# Patient Record
Sex: Male | Born: 1950 | Race: White | Hispanic: No | Marital: Single | Smoking: Current every day smoker
Health system: Southern US, Community
[De-identification: ages and names within clinical notes are randomized; demographics above are authoritative.]

## PROBLEM LIST (undated history)

## (undated) DIAGNOSIS — F250 Schizoaffective disorder, bipolar type: Secondary | ICD-10-CM

## (undated) DIAGNOSIS — F319 Bipolar disorder, unspecified: Secondary | ICD-10-CM

## (undated) DIAGNOSIS — I10 Essential (primary) hypertension: Secondary | ICD-10-CM

## (undated) DIAGNOSIS — F259 Schizoaffective disorder, unspecified: Secondary | ICD-10-CM

---

## 2011-10-23 ENCOUNTER — Other Ambulatory Visit: Payer: Self-pay

## 2011-10-23 ENCOUNTER — Emergency Department (HOSPITAL_COMMUNITY)
Admission: EM | Admit: 2011-10-23 | Discharge: 2011-10-23 | Disposition: A | Discharge status: Home / Self Care | Payer: Medicare Other | Attending: Emergency Medicine | Admitting: Emergency Medicine

## 2011-10-23 ENCOUNTER — Emergency Department (HOSPITAL_COMMUNITY): Payer: Medicare Other

## 2011-10-23 DIAGNOSIS — R070 Pain in throat: Secondary | ICD-10-CM | POA: Insufficient documentation

## 2011-10-23 DIAGNOSIS — J3489 Other specified disorders of nose and nasal sinuses: Secondary | ICD-10-CM | POA: Insufficient documentation

## 2011-10-23 DIAGNOSIS — F319 Bipolar disorder, unspecified: Secondary | ICD-10-CM | POA: Insufficient documentation

## 2011-10-23 DIAGNOSIS — R0989 Other specified symptoms and signs involving the circulatory and respiratory systems: Secondary | ICD-10-CM | POA: Insufficient documentation

## 2011-10-23 DIAGNOSIS — E119 Type 2 diabetes mellitus without complications: Secondary | ICD-10-CM | POA: Insufficient documentation

## 2011-10-23 DIAGNOSIS — R05 Cough: Secondary | ICD-10-CM | POA: Insufficient documentation

## 2011-10-23 DIAGNOSIS — Z8659 Personal history of other mental and behavioral disorders: Secondary | ICD-10-CM | POA: Insufficient documentation

## 2011-10-23 DIAGNOSIS — F411 Generalized anxiety disorder: Secondary | ICD-10-CM | POA: Insufficient documentation

## 2011-10-23 DIAGNOSIS — J111 Influenza due to unidentified influenza virus with other respiratory manifestations: Secondary | ICD-10-CM | POA: Insufficient documentation

## 2011-10-23 DIAGNOSIS — Z79899 Other long term (current) drug therapy: Secondary | ICD-10-CM | POA: Insufficient documentation

## 2011-10-23 DIAGNOSIS — R0602 Shortness of breath: Secondary | ICD-10-CM | POA: Insufficient documentation

## 2011-10-23 DIAGNOSIS — R509 Fever, unspecified: Secondary | ICD-10-CM

## 2011-10-23 DIAGNOSIS — Z7982 Long term (current) use of aspirin: Secondary | ICD-10-CM | POA: Insufficient documentation

## 2011-10-23 DIAGNOSIS — R059 Cough, unspecified: Secondary | ICD-10-CM | POA: Insufficient documentation

## 2011-10-23 DIAGNOSIS — I10 Essential (primary) hypertension: Secondary | ICD-10-CM | POA: Insufficient documentation

## 2011-10-23 DIAGNOSIS — R5381 Other malaise: Secondary | ICD-10-CM | POA: Insufficient documentation

## 2011-10-23 HISTORY — DX: Bipolar disorder, unspecified: F31.9

## 2011-10-23 HISTORY — DX: Essential (primary) hypertension: I10

## 2011-10-23 HISTORY — DX: Schizoaffective disorder, unspecified: F25.9

## 2011-10-23 HISTORY — DX: Schizoaffective disorder, bipolar type: F25.0

## 2011-10-23 LAB — GLUCOSE, CAPILLARY: Glucose-Capillary: 156 mg/dL — ABNORMAL HIGH (ref 70–99)

## 2011-10-23 MED ORDER — SODIUM CHLORIDE 0.9 % IV BOLUS (SEPSIS)
1000.0000 mL | Freq: Once | INTRAVENOUS | Status: AC
  Administered 2011-10-23: 1000 mL via INTRAVENOUS

## 2011-10-23 MED ORDER — IBUPROFEN 400 MG PO TABS
400.0000 mg | ORAL_TABLET | Freq: Once | ORAL | Status: AC
  Administered 2011-10-23: 400 mg via ORAL
  Filled 2011-10-23: qty 1

## 2011-10-23 NOTE — ED Provider Notes (Signed)
History   This chart was scribed for Raeford Razor, MD by Clarita Crane. The patient was seen in room APA02/APA02 and the patient's care was started at 3:08PM.   CSN: [id number] Arrival date & time: 10/23/2011  2:54 PM   First MD Initiated Contact with Patient 10/23/11 1505      Chief Complaint  Patient presents with  . Influenza    (Consider location/radiation/quality/duration/timing/severity/associated sxs/prior treatment) HPI Kevin Fowler is a 60 y.o. male who presents to the Emergency Department complaining of constant moderate cold-like symptoms onset 2 days ago and persistent since with associated nasal and chest congestion, sore throat, weakness, fatigue, cough, mild SOB and fever. Denies chest pain, abdominal pain, chills, nausea. Patient reports having recent sick contact with roommate who is suffering from similar symptoms.  Patient with h/o diabetes, hypertension, bipolar 1 disorder, schizophrenia.   Past Medical History  Diagnosis Date  . Diabetes mellitus   . Hypertension   . Bipolar 1 disorder   . Schizo affective schizophrenia     History reviewed. No pertinent past surgical history.  No family history on file.  History  Substance Use Topics  . Smoking status: Former Games developer  . Smokeless tobacco: Not on file  . Alcohol Use: No      Review of Systems 10 Systems reviewed and are negative for acute change except as noted in the HPI.  Allergies  Thorazine  Home Medications   Current Outpatient Rx  Name Route Sig Dispense Refill  . AMLODIPINE BESYLATE 2.5 MG PO TABS Oral Take 2.5 mg by mouth daily.      . ASPIRIN EC 81 MG PO TBEC Oral Take 81 mg by mouth daily.      Marland Kitchen DIPHENHYDRAMINE HCL 50 MG PO CAPS Oral Take 50 mg by mouth 2 (two) times daily.      Marland Kitchen LEVOTHYROXINE SODIUM 50 MCG PO TABS Oral Take 50 mcg by mouth daily.      Marland Kitchen LORAZEPAM 0.5 MG PO TABS Oral Take 0.5 mg by mouth 2 (two) times daily.      Marland Kitchen OLANZAPINE 15 MG PO TABS Oral Take 30 mg by  mouth at bedtime.      Marland Kitchen RISPERIDONE 3 MG PO TABS Oral Take 3 mg by mouth at bedtime.      . TAMSULOSIN HCL 0.4 MG PO CAPS Oral Take 0.4 mg by mouth daily.        BP 107/82  Pulse 72  Temp(Src) 100.8 F (38.2 C) (Oral)  Resp 22  Ht 5\' 11"  (1.803 m)  Wt 200 lb (90.719 kg)  BMI 27.89 kg/m2  SpO2 98%  Physical Exam  Nursing note and vitals reviewed. Constitutional: He is oriented to person, place, and time. He appears well-developed and well-nourished. No distress.  HENT:  Head: Normocephalic and atraumatic.  Right Ear: Tympanic membrane normal.  Left Ear: Tympanic membrane normal.  Mouth/Throat: Oropharynx is clear and moist. No oropharyngeal exudate.       Poor dentition.   Eyes: Conjunctivae and EOM are normal. Pupils are equal, round, and reactive to light.  Neck: Neck supple. No tracheal deviation present. No thyromegaly present.  Cardiovascular: Normal rate and regular rhythm.  Exam reveals no gallop and no friction rub.   No murmur heard. Pulmonary/Chest: Effort normal. No respiratory distress. He has no wheezes.  Abdominal: Soft. Bowel sounds are normal. He exhibits no distension. There is no tenderness.  Musculoskeletal: Normal range of motion. He exhibits no edema.  Lymphadenopathy:  He has no cervical adenopathy.  Neurological: He is alert and oriented to person, place, and time. No sensory deficit.  Skin: Skin is warm and dry.  Psychiatric: His speech is normal. His mood appears anxious.    ED Course  Procedures (including critical care time)  DIAGNOSTIC STUDIES: Oxygen Saturation is 98% on room air, normal by my interpretation.    COORDINATION OF CARE: 4:30PM- Patient notes experiencing polydipsia recently. Patient reports he is feeling better at this time following administration of IVFs and Ibuprofen. Informed of imaging results and probable cause of current symptoms. Will obtain a CBG to which the patient is agreeable to.    Labs Reviewed - No data to  display Dg Chest 2 View  10/23/2011  *RADIOLOGY REPORT*  Clinical Data: Fever, cough, hypertension  CHEST - 2 VIEW  Comparison: None  Findings: Minimal enlargement of cardiac silhouette. Mediastinal contours pulmonary vascularity normal. Bronchitic changes with mild bibasilar atelectasis. No definite pulmonary infiltrate, pleural effusion or pneumothorax. Old bilateral rib fractures. No acute bony abnormalities identified.  IMPRESSION: Bronchitic changes with bibasilar atelectasis. Enlargement of cardiac silhouette.  Original Report Authenticated By: Lollie Marrow, M.D.   EKG:  Rhythm: normal sinus Rate: 79 AXIS: NORMAL Intervals: normal ST segments: normal Comparison: none provided   1. Fever   2. Influenza       MDM  60yM with general malaise and lightheadedness. Suspect viral illness, possibly flu. Suspect symptoms in triage area related to this too. Febrile, but not toxic. Low clinical suspicion for SBI. Exam nonfocal. CXR with no focal infiltrate. EKG nondiagnostic. Improved after IVF and dose ibuprofen. Tolerated PO in ED and CBG in 150s. Plan symptomatic tx and given strict return precautions.      I personally preformed the services scribed in my presence. The recorded information has been reviewed and considered. Raeford Razor, MD.    Raeford Razor, MD 10/26/11 [phone]

## 2011-10-23 NOTE — ED Notes (Signed)
Pt came in for "flu-like" symptoms.  Upon arrival to ED front desk, pt became lightheaded and was lowered to the floor.  Pt reports feeling "achy all over".  Pt denies any n/v or decreased appetite.  Pt alert and oriented x 3.

## 2012-02-26 ENCOUNTER — Encounter (HOSPITAL_COMMUNITY): Payer: Self-pay | Admitting: *Deleted

## 2012-02-26 ENCOUNTER — Emergency Department (HOSPITAL_COMMUNITY)
Admission: EM | Admit: 2012-02-26 | Discharge: 2012-02-26 | Disposition: A | Discharge status: Other Acute Inpatient Hospital | Payer: Medicare Other | Attending: Emergency Medicine | Admitting: Emergency Medicine

## 2012-02-26 DIAGNOSIS — Z760 Encounter for issue of repeat prescription: Secondary | ICD-10-CM | POA: Insufficient documentation

## 2012-02-26 DIAGNOSIS — Z79899 Other long term (current) drug therapy: Secondary | ICD-10-CM | POA: Insufficient documentation

## 2012-02-26 DIAGNOSIS — F29 Unspecified psychosis not due to a substance or known physiological condition: Secondary | ICD-10-CM | POA: Insufficient documentation

## 2012-02-26 DIAGNOSIS — F319 Bipolar disorder, unspecified: Secondary | ICD-10-CM | POA: Insufficient documentation

## 2012-02-26 DIAGNOSIS — I10 Essential (primary) hypertension: Secondary | ICD-10-CM | POA: Insufficient documentation

## 2012-02-26 DIAGNOSIS — E119 Type 2 diabetes mellitus without complications: Secondary | ICD-10-CM | POA: Insufficient documentation

## 2012-02-26 MED ORDER — LORAZEPAM 1 MG PO TABS
1.0000 mg | ORAL_TABLET | Freq: Once | ORAL | Status: AC
  Administered 2012-02-26: 1 mg via ORAL
  Filled 2012-02-26: qty 1

## 2012-02-26 MED ORDER — ZIPRASIDONE MESYLATE 20 MG IM SOLR
10.0000 mg | Freq: Once | INTRAMUSCULAR | Status: AC
  Administered 2012-02-26: 10 mg via INTRAMUSCULAR
  Filled 2012-02-26: qty 20

## 2012-02-26 NOTE — ED Provider Notes (Signed)
History  This chart was scribed for [employer]. Colon Branch, MD by Cherlynn Perches. The patient was seen in room APA17/APA17. Patient's care was started at 1303.      CSN: [id number]  Arrival date & time 02/26/12  1303   First MD Initiated Contact with Patient 02/26/12 1410      Chief Complaint  Patient presents with  . Medication Refill    (Consider location/radiation/quality/duration/timing/severity/associated sxs/prior treatment) HPI A Level 5 Caveat Applies due to Psychosis Kevin Fowler is a 61 y.o. male who presents to the Emergency Department accompanied with a member of staff from his group home who states patient's psychosis has worsened as a result of not receiving Risperidone and Zyprexa for the past 2 weeks. Staff member notes patient's physician was contacted in order to have prescriptions refilled but they were unavailable after visiting the pharmacy earlier. PCP is Dr. Copper  Past Medical History  Diagnosis Date  . Diabetes mellitus   . Hypertension   . Bipolar 1 disorder   . Schizo affective schizophrenia     History reviewed. No pertinent past surgical history.  No family history on file.  History  Substance Use Topics  . Smoking status: Former Games developer  . Smokeless tobacco: Not on file  . Alcohol Use: No      Review of Systems  Unable to perform ROS: Mental status change    Allergies  Thorazine  Home Medications   Current Outpatient Rx  Name Route Sig Dispense Refill  . AMLODIPINE BESYLATE 2.5 MG PO TABS Oral Take 2.5 mg by mouth daily.      . ASPIRIN EC 81 MG PO TBEC Oral Take 81 mg by mouth daily.      Marland Kitchen DIPHENHYDRAMINE HCL 50 MG PO CAPS Oral Take 50 mg by mouth 2 (two) times daily.      Marland Kitchen LEVOTHYROXINE SODIUM 50 MCG PO TABS Oral Take 50 mcg by mouth daily.      Marland Kitchen OLANZAPINE 15 MG PO TABS Oral Take 30 mg by mouth at bedtime.      Marland Kitchen RISPERIDONE 3 MG PO TABS Oral Take 3 mg by mouth at bedtime.      . TAMSULOSIN HCL 0.4 MG PO CAPS Oral Take 0.4 mg  by mouth daily.        BP 144/94  Pulse 105  Temp(Src) 99 F (37.2 C) (Oral)  Resp 16  SpO2 97%  Physical Exam  Nursing note and vitals reviewed. Constitutional:       Awake, alert, talking nonsense, angry appearance  HENT:  Head: Atraumatic.  Eyes: Right eye exhibits no discharge. Left eye exhibits no discharge.  Neck: Neck supple.  Pulmonary/Chest: Effort normal. He exhibits no tenderness.  Abdominal: Soft. There is no tenderness. There is no rebound.  Musculoskeletal: He exhibits no tenderness.       Baseline ROM, no obvious new focal weakness.  Neurological:        motor strength appears baseline for patient and situation.  Skin: No rash noted.  Psychiatric: His affect is angry. He is agitated.       Agitated, talking nonsense. Psychotic.     ED Course  Procedures (including critical care time)  DIAGNOSTIC STUDIES: Oxygen Saturation is 97% on room air, normal by my interpretation.    COORDINATION OF CARE: 2:39PM- Patient's pharmacy consulted following initial exam to check on availability of medications. Pharmacist states patient's Zyprexa and Risperidone are ready for pick up at this time.    1500 Spoke  with owner and resident caretaker at the home where the patient stays. She advised that now we have verified his medicines are available, she is comfortable picking up with medicines and administering them to the patient. She is comfortable with the patient despite his current behavior and psychosis. She states she has cared for him for 8 years and is familiar with how to diffuse his agitation.   MDM  Bipolar, schizoaffective patient who has been off of his medicines through no fault of his own, now with increased agitation and psychosis. Resident of a family home where the caretaker is comfortable caring for the patient in his current state now that we have verified the availability of his standard medications.Pt with no significant deterioration in condition in the  ED. .The patient appears reasonably screened and/or stabilized for discharge and I doubt any other medical condition or other [facility] requiring further screening, evaluation, or treatment in the ED at this time prior to discharge.   I personally performed the services described in this documentation, which was scribed in my presence. The recorded information has been reviewed and considered.   MDM Reviewed: nursing note and vitals        Nicoletta Dress. Colon Branch, MD 02/29/12 1622
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
## 2012-02-26 NOTE — Discharge Instructions (Signed)
Kevin Fowler's pharmacy has said that there are medicines at the pharmacy for. I have given him a shot of Geodon here with some Ativan. He can begin his regular nighttime medicines again tonight that would be 3 mg of risperidone and 15 mg of Zyprexa. If he does not calm down he should be returned to the emergency room for further evaluation.

## 2012-02-26 NOTE — ED Notes (Signed)
Caregiver states pt was taken off of meds for schizophrenia. Here for medication. Pt continuously talking in triage. Denies SI/HI

## 2013-08-09 IMAGING — CR DG CHEST 2V
2 series · 2 of 2 positions shown · non-contrast
Comparison: None

CLINICAL DATA: Fever, cough, hypertension

CHEST - 2 VIEW

[view not recorded (1 of 2)]
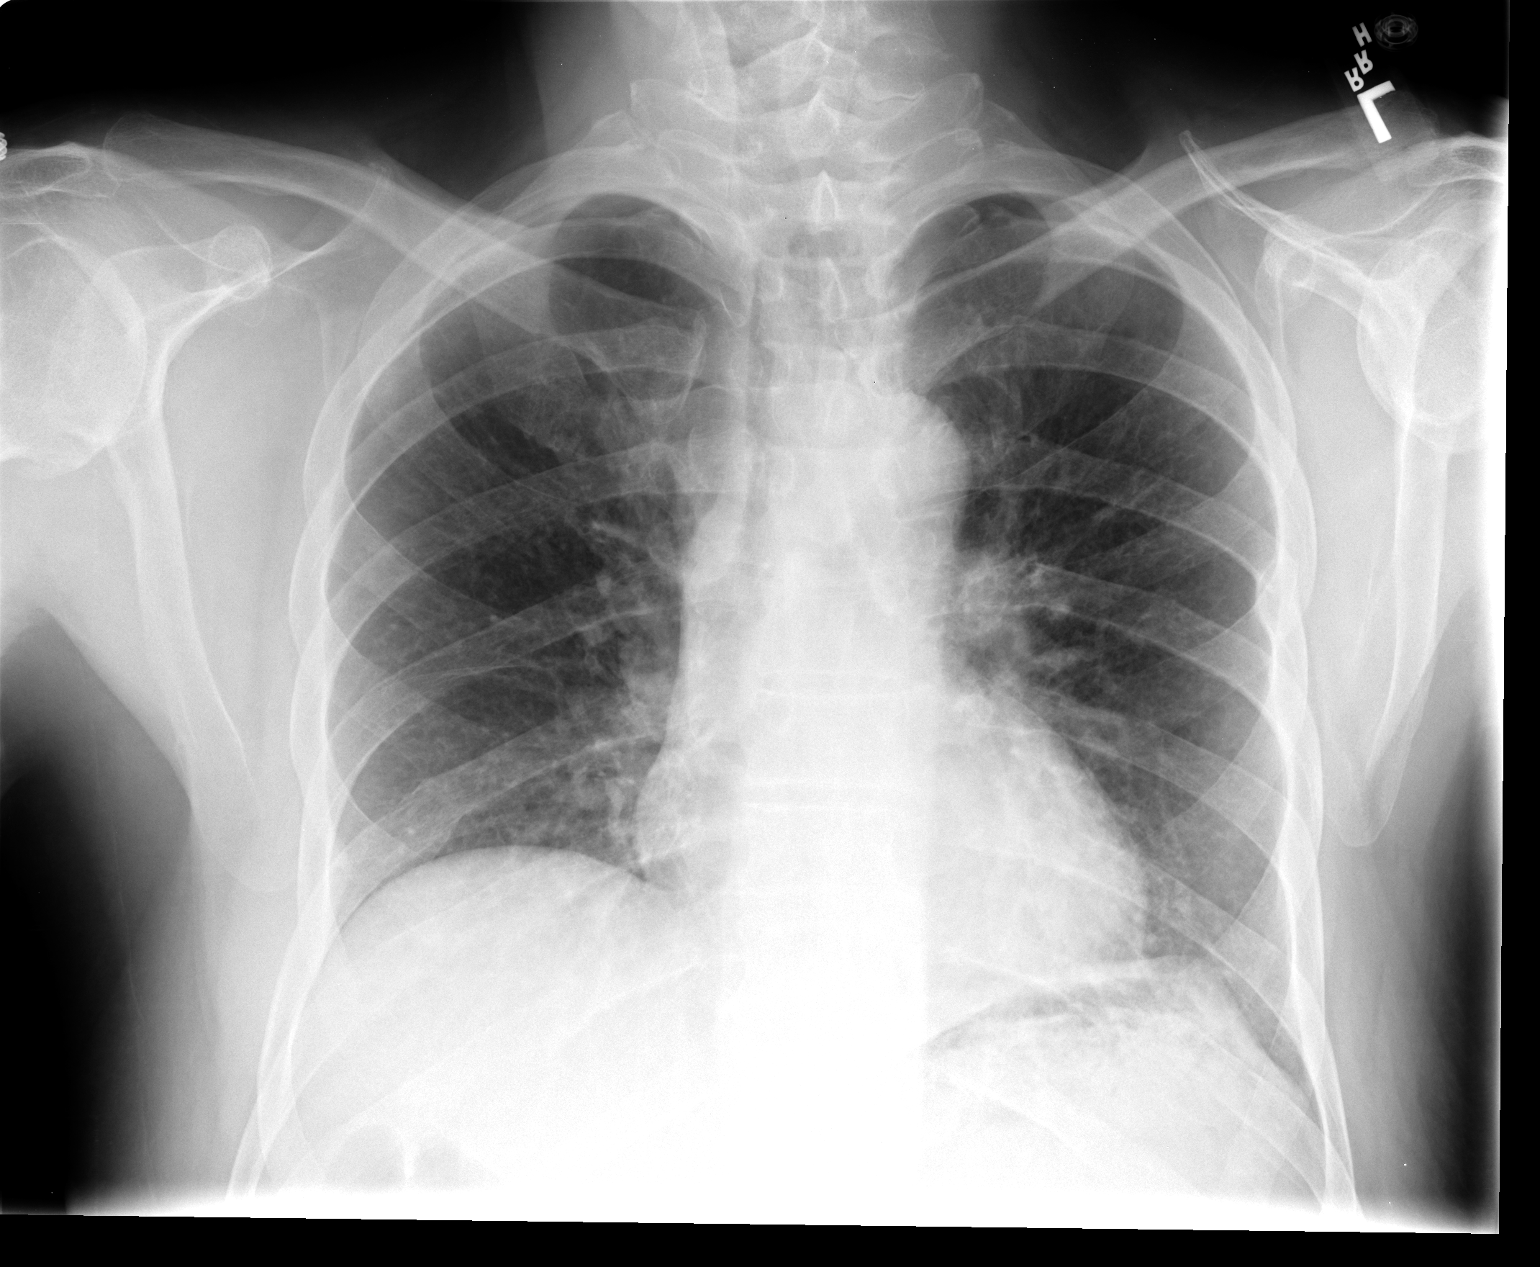

[view not recorded (2 of 2)]
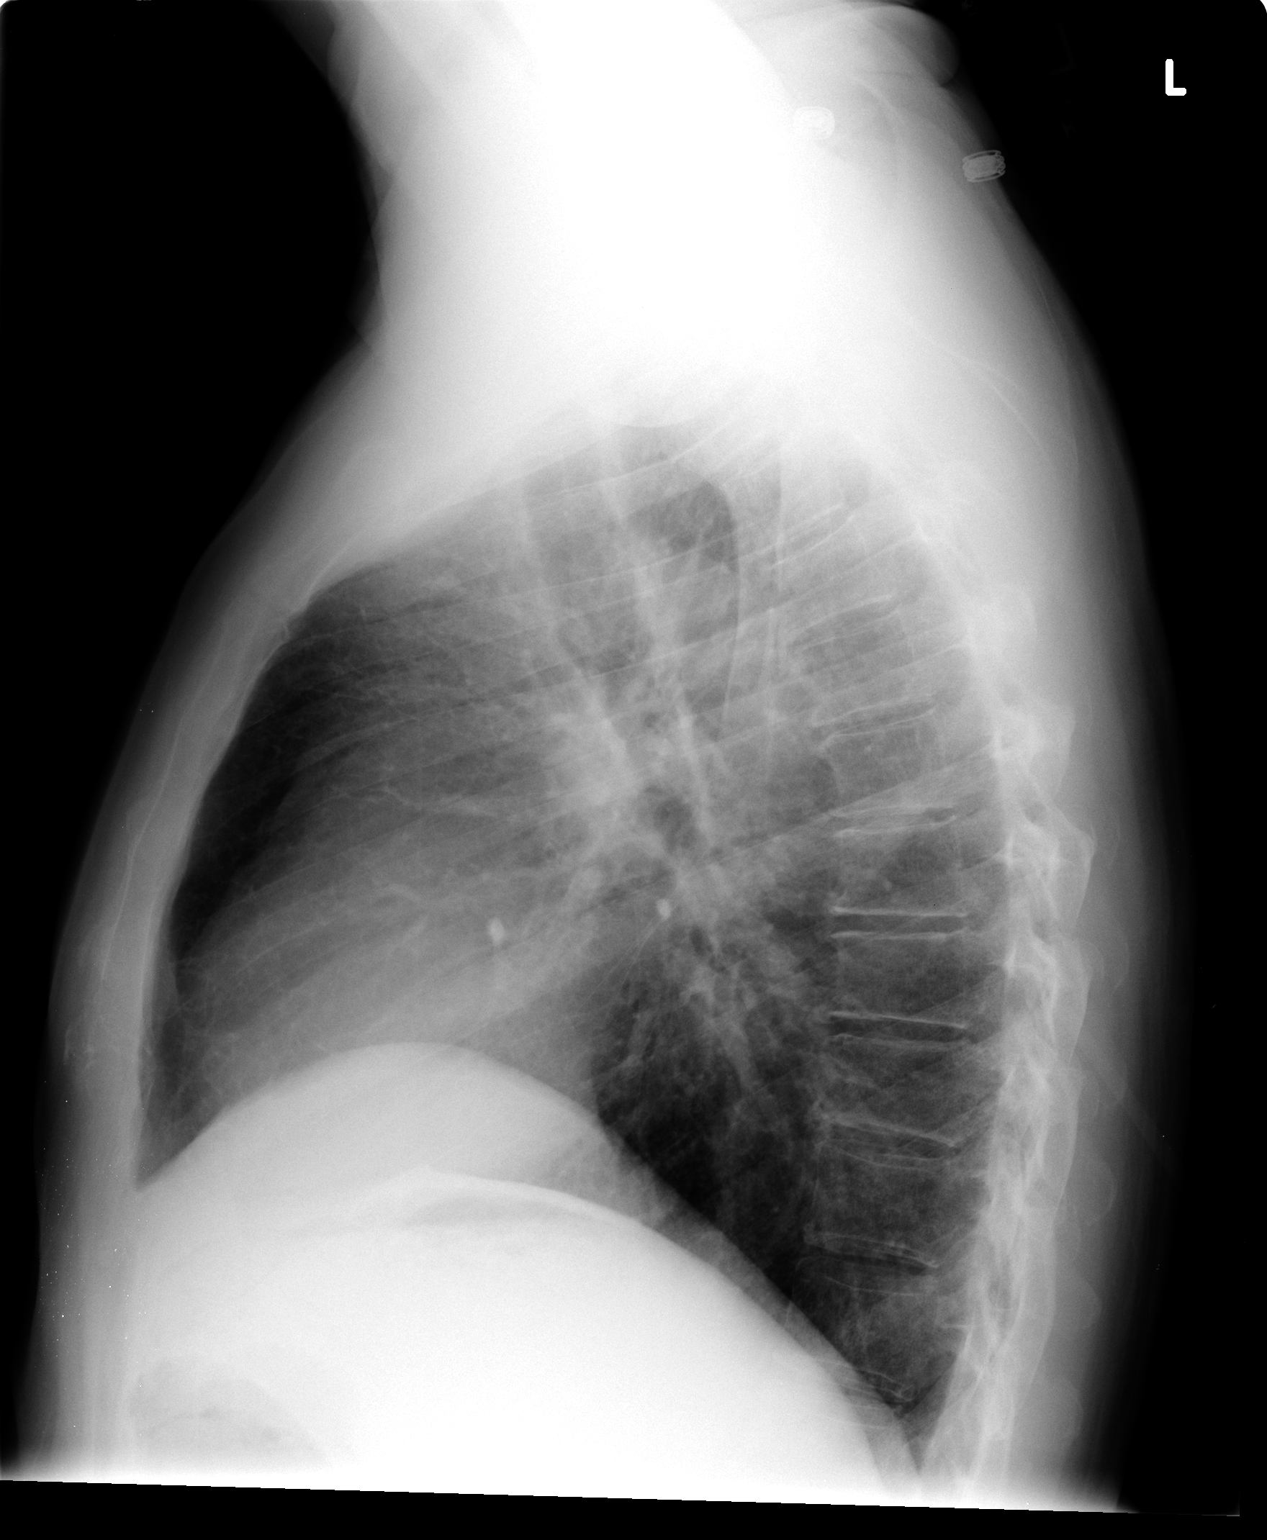

[2 of 2 positions shown; findings below may reference images not displayed]

FINDINGS: Minimal enlargement of cardiac silhouette.
Mediastinal contours pulmonary vascularity normal.
Bronchitic changes with mild bibasilar atelectasis.
No definite pulmonary infiltrate, pleural effusion or pneumothorax.
Old bilateral rib fractures.
No acute bony abnormalities identified.
IMPRESSION: Bronchitic changes with bibasilar atelectasis.
Enlargement of cardiac silhouette.

## 2013-12-17 ENCOUNTER — Emergency Department (HOSPITAL_COMMUNITY)
Admission: EM | Admit: 2013-12-17 | Discharge: 2013-12-18 | Disposition: A | Discharge status: Other Acute Inpatient Hospital | Payer: Medicare Other | Source: Home / Self Care | Attending: Emergency Medicine | Admitting: Emergency Medicine

## 2013-12-17 ENCOUNTER — Encounter (HOSPITAL_COMMUNITY): Payer: Self-pay | Admitting: Emergency Medicine

## 2013-12-17 DIAGNOSIS — Z87891 Personal history of nicotine dependence: Secondary | ICD-10-CM | POA: Insufficient documentation

## 2013-12-17 DIAGNOSIS — E119 Type 2 diabetes mellitus without complications: Secondary | ICD-10-CM | POA: Insufficient documentation

## 2013-12-17 DIAGNOSIS — Z79899 Other long term (current) drug therapy: Secondary | ICD-10-CM

## 2013-12-17 DIAGNOSIS — F29 Unspecified psychosis not due to a substance or known physiological condition: Secondary | ICD-10-CM

## 2013-12-17 DIAGNOSIS — Z7982 Long term (current) use of aspirin: Secondary | ICD-10-CM | POA: Insufficient documentation

## 2013-12-17 DIAGNOSIS — I10 Essential (primary) hypertension: Secondary | ICD-10-CM | POA: Insufficient documentation

## 2013-12-17 DIAGNOSIS — F259 Schizoaffective disorder, unspecified: Secondary | ICD-10-CM

## 2013-12-17 DIAGNOSIS — F3130 Bipolar disorder, current episode depressed, mild or moderate severity, unspecified: Secondary | ICD-10-CM

## 2013-12-17 DIAGNOSIS — F209 Schizophrenia, unspecified: Secondary | ICD-10-CM

## 2013-12-17 LAB — BASIC METABOLIC PANEL
BUN: 19 mg/dL (ref 6–23)
CHLORIDE: 102 meq/L (ref 96–112)
CO2: 28 meq/L (ref 19–32)
CREATININE: 0.96 mg/dL (ref 0.50–1.35)
Calcium: 8.8 mg/dL (ref 8.4–10.5)
GFR calc Af Amer: >90 mL/min (ref >90)
GFR calc non Af Amer: 86 mL/min — ABNORMAL LOW (ref >90)
Glucose, Bld: 91 mg/dL (ref 70–99)
Potassium: 3.6 mEq/L — ABNORMAL LOW (ref 3.7–5.3)
Sodium: 142 mEq/L (ref 137–147)

## 2013-12-17 LAB — RAPID URINE DRUG SCREEN, HOSP PERFORMED
AMPHETAMINES: NOT DETECTED
BARBITURATES: NOT DETECTED
BENZODIAZEPINES: NOT DETECTED
Cocaine: NOT DETECTED
Opiates: NOT DETECTED
Tetrahydrocannabinol: NOT DETECTED

## 2013-12-17 LAB — HEPATIC FUNCTION PANEL
ALT: 23 U/L (ref 0–53)
AST: 22 U/L (ref 0–37)
Albumin: 3.6 g/dL (ref 3.5–5.2)
Alkaline Phosphatase: 95 U/L (ref 39–117)
BILIRUBIN INDIRECT: 0.5 mg/dL (ref 0.3–0.9)
Bilirubin, Direct: 0.2 mg/dL (ref 0.0–0.3)
TOTAL PROTEIN: 6.9 g/dL (ref 6.0–8.3)
Total Bilirubin: 0.7 mg/dL (ref 0.3–1.2)

## 2013-12-17 LAB — CBC
HEMATOCRIT: 38.7 % — AB (ref 39.0–52.0)
Hemoglobin: 12.9 g/dL — ABNORMAL LOW (ref 13.0–17.0)
MCH: 30.6 pg (ref 26.0–34.0)
MCHC: 33.3 g/dL (ref 30.0–36.0)
MCV: 91.7 fL (ref 78.0–100.0)
Platelets: 206 10*3/uL (ref 150–400)
RBC: 4.22 MIL/uL (ref 4.22–5.81)
RDW: 13.7 % (ref 11.5–15.5)
WBC: 9.8 10*3/uL (ref 4.0–10.5)

## 2013-12-17 LAB — ETHANOL: Alcohol, Ethyl (B): <11 mg/dL (ref 0–11)

## 2013-12-17 MED ORDER — RISPERIDONE 1 MG PO TABS
ORAL_TABLET | ORAL | Status: AC
  Filled 2013-12-17: qty 3

## 2013-12-17 MED ORDER — TAMSULOSIN HCL 0.4 MG PO CAPS
0.4000 mg | ORAL_CAPSULE | Freq: Every day | ORAL | Status: DC
  Administered 2013-12-18: 0.4 mg via ORAL
  Filled 2013-12-17 (×5): qty 1

## 2013-12-17 MED ORDER — DIPHENHYDRAMINE HCL 25 MG PO CAPS
50.0000 mg | ORAL_CAPSULE | Freq: Two times a day (BID) | ORAL | Status: DC
  Administered 2013-12-17 – 2013-12-18 (×2): 50 mg via ORAL
  Filled 2013-12-17 (×2): qty 2

## 2013-12-17 MED ORDER — RISPERIDONE 1 MG PO TABS
3.0000 mg | ORAL_TABLET | Freq: Every day | ORAL | Status: DC
  Administered 2013-12-17: 3 mg via ORAL
  Filled 2013-12-17 (×5): qty 3

## 2013-12-17 MED ORDER — AMLODIPINE BESYLATE 5 MG PO TABS
2.5000 mg | ORAL_TABLET | Freq: Every day | ORAL | Status: DC
  Administered 2013-12-18: 2.5 mg via ORAL
  Filled 2013-12-17: qty 1

## 2013-12-17 MED ORDER — FLUPHENAZINE HCL 5 MG PO TABS
ORAL_TABLET | ORAL | Status: AC
  Filled 2013-12-17: qty 2

## 2013-12-17 MED ORDER — FLUPHENAZINE HCL 5 MG PO TABS
10.0000 mg | ORAL_TABLET | Freq: Two times a day (BID) | ORAL | Status: DC
  Administered 2013-12-17 – 2013-12-18 (×2): 10 mg via ORAL
  Filled 2013-12-17: qty 1
  Filled 2013-12-17 (×3): qty 2
  Filled 2013-12-17: qty 1
  Filled 2013-12-17 (×3): qty 2
  Filled 2013-12-17 (×2): qty 1

## 2013-12-17 MED ORDER — ASPIRIN EC 81 MG PO TBEC
81.0000 mg | DELAYED_RELEASE_TABLET | Freq: Every day | ORAL | Status: DC
  Administered 2013-12-18: 81 mg via ORAL
  Filled 2013-12-17 (×5): qty 1

## 2013-12-17 MED ORDER — OLANZAPINE 5 MG PO TABS
30.0000 mg | ORAL_TABLET | Freq: Every day | ORAL | Status: DC
  Administered 2013-12-17: 30 mg via ORAL
  Filled 2013-12-17: qty 6
  Filled 2013-12-17 (×2): qty 3
  Filled 2013-12-17 (×2): qty 6

## 2013-12-17 MED ORDER — LORAZEPAM 1 MG PO TABS: 1.0000 mg | ORAL_TABLET | ORAL | Status: DC | PRN

## 2013-12-17 MED ORDER — LEVOTHYROXINE SODIUM 50 MCG PO TABS
50.0000 ug | ORAL_TABLET | Freq: Every day | ORAL | Status: DC
  Administered 2013-12-18: 50 ug via ORAL
  Filled 2013-12-17 (×5): qty 1

## 2013-12-17 MED ORDER — LEVOTHYROXINE SODIUM 50 MCG PO TABS: 50.0000 ug | ORAL_TABLET | Freq: Every day | ORAL | Status: DC

## 2013-12-17 NOTE — ED Notes (Signed)
Per Caregiver, Tarri AbernethyJeanette Johnson, pt started "wandering off from home" last week after having meds changed.  States pt has had no problems until last week.

## 2013-12-17 NOTE — ED Notes (Signed)
Pt calm, cooperative, pleasant upon entering room to deliver meal tray.  Pt up to bathroom with deputy.  [employer] at bedside at this time.
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
## 2013-12-17 NOTE — ED Notes (Signed)
Pt was wanded by security.  
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
## 2013-12-17 NOTE — BH Assessment (Signed)
[facility] Assessment Progress Note  At 16:10 I spoke to EDP Bethann BerkshireJoseph Zammit, MD in anticipation of TTS assessment scheduled for 16:30.  Doylene Canninghomas Omario Ander, MA Triage Specialist 12/17/2013 @ 16:14

## 2013-12-17 NOTE — ED Notes (Signed)
Pt. Agitated but cooperative. Pt. Talking to himself.
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
## 2013-12-17 NOTE — ED Notes (Signed)
telepsych consult being completed at this time. [employer] at bedside.

## 2013-12-17 NOTE — ED Notes (Signed)
Pt up to bathroom with deputy, after walking out of bathroom, pt began jerking and slamming bathroom door and yelling.  Pt escorted back to room by [employer].

## 2013-12-18 ENCOUNTER — Inpatient Hospital Stay (HOSPITAL_COMMUNITY)
Admission: AD | Admit: 2013-12-18 | Discharge: 2013-12-25 | DRG: 885 | Disposition: A | Discharge status: Home / Self Care | Payer: Medicare Other | Source: Intra-hospital | Attending: Psychiatry | Admitting: Psychiatry

## 2013-12-18 ENCOUNTER — Encounter (HOSPITAL_COMMUNITY): Payer: Self-pay | Admitting: *Deleted

## 2013-12-18 DIAGNOSIS — N400 Enlarged prostate without lower urinary tract symptoms: Secondary | ICD-10-CM | POA: Diagnosis present

## 2013-12-18 DIAGNOSIS — I10 Essential (primary) hypertension: Secondary | ICD-10-CM | POA: Diagnosis present

## 2013-12-18 DIAGNOSIS — E876 Hypokalemia: Secondary | ICD-10-CM | POA: Diagnosis present

## 2013-12-18 DIAGNOSIS — G4700 Insomnia, unspecified: Secondary | ICD-10-CM | POA: Diagnosis present

## 2013-12-18 DIAGNOSIS — E119 Type 2 diabetes mellitus without complications: Secondary | ICD-10-CM | POA: Diagnosis present

## 2013-12-18 DIAGNOSIS — Z5987 Material hardship due to limited financial resources, not elsewhere classified: Secondary | ICD-10-CM

## 2013-12-18 DIAGNOSIS — E039 Hypothyroidism, unspecified: Secondary | ICD-10-CM | POA: Diagnosis present

## 2013-12-18 DIAGNOSIS — Z598 Other problems related to housing and economic circumstances: Secondary | ICD-10-CM

## 2013-12-18 DIAGNOSIS — F259 Schizoaffective disorder, unspecified: Principal | ICD-10-CM | POA: Diagnosis present

## 2013-12-18 DIAGNOSIS — F200 Paranoid schizophrenia: Secondary | ICD-10-CM

## 2013-12-18 DIAGNOSIS — F17200 Nicotine dependence, unspecified, uncomplicated: Secondary | ICD-10-CM | POA: Diagnosis present

## 2013-12-18 MED ORDER — OLANZAPINE 7.5 MG PO TABS
15.0000 mg | ORAL_TABLET | Freq: Every day | ORAL | Status: DC
  Administered 2013-12-18: 15 mg via ORAL
  Filled 2013-12-18 (×3): qty 2

## 2013-12-18 MED ORDER — AMLODIPINE BESYLATE 2.5 MG PO TABS
2.5000 mg | ORAL_TABLET | Freq: Every day | ORAL | Status: DC
  Administered 2013-12-19 – 2013-12-25 (×7): 2.5 mg via ORAL
  Filled 2013-12-18 (×8): qty 1

## 2013-12-18 MED ORDER — MAGNESIUM HYDROXIDE 400 MG/5ML PO SUSP
30.0000 mL | Freq: Every day | ORAL | Status: DC | PRN
  Administered 2013-12-23: 30 mL via ORAL

## 2013-12-18 MED ORDER — ASPIRIN EC 81 MG PO TBEC
81.0000 mg | DELAYED_RELEASE_TABLET | Freq: Every day | ORAL | Status: DC
  Administered 2013-12-19 – 2013-12-25 (×7): 81 mg via ORAL
  Filled 2013-12-18 (×9): qty 1

## 2013-12-18 MED ORDER — TAMSULOSIN HCL 0.4 MG PO CAPS
0.4000 mg | ORAL_CAPSULE | Freq: Every day | ORAL | Status: DC
  Administered 2013-12-19 – 2013-12-25 (×7): 0.4 mg via ORAL
  Filled 2013-12-18 (×9): qty 1

## 2013-12-18 MED ORDER — INFLUENZA VAC SPLIT QUAD 0.5 ML IM SUSP
0.5000 mL | INTRAMUSCULAR | Status: AC
  Administered 2013-12-19: 0.5 mL via INTRAMUSCULAR
  Filled 2013-12-18: qty 0.5

## 2013-12-18 MED ORDER — FLUPHENAZINE HCL 5 MG PO TABS
10.0000 mg | ORAL_TABLET | Freq: Two times a day (BID) | ORAL | Status: DC
  Administered 2013-12-18 – 2013-12-25 (×14): 10 mg via ORAL
  Filled 2013-12-18: qty 1
  Filled 2013-12-18: qty 2
  Filled 2013-12-18 (×3): qty 1
  Filled 2013-12-18: qty 12
  Filled 2013-12-18 (×3): qty 1
  Filled 2013-12-18: qty 2
  Filled 2013-12-18: qty 12
  Filled 2013-12-18 (×6): qty 1
  Filled 2013-12-18: qty 2
  Filled 2013-12-18 (×3): qty 1

## 2013-12-18 MED ORDER — ACETAMINOPHEN 325 MG PO TABS
650.0000 mg | ORAL_TABLET | Freq: Four times a day (QID) | ORAL | Status: DC | PRN
Start: 2013-12-18 — End: 2013-12-25

## 2013-12-18 MED ORDER — ALUM & MAG HYDROXIDE-SIMETH 200-200-20 MG/5ML PO SUSP: 30.0000 mL | ORAL | Status: DC | PRN

## 2013-12-18 MED ORDER — LEVOTHYROXINE SODIUM 50 MCG PO TABS
50.0000 ug | ORAL_TABLET | Freq: Every day | ORAL | Status: DC
  Administered 2013-12-19 – 2013-12-25 (×7): 50 ug via ORAL
  Filled 2013-12-18 (×4): qty 1
  Filled 2013-12-18: qty 2
  Filled 2013-12-18 (×4): qty 1

## 2013-12-18 MED ORDER — BENZTROPINE MESYLATE 0.5 MG PO TABS
0.5000 mg | ORAL_TABLET | Freq: Two times a day (BID) | ORAL | Status: DC
  Administered 2013-12-18 – 2013-12-21 (×6): 0.5 mg via ORAL
  Filled 2013-12-18 (×10): qty 1

## 2013-12-18 MED ORDER — PNEUMOCOCCAL VAC POLYVALENT 25 MCG/0.5ML IJ INJ
0.5000 mL | INJECTION | INTRAMUSCULAR | Status: AC
  Administered 2013-12-19: 0.5 mL via INTRAMUSCULAR

## 2013-12-18 NOTE — ED Notes (Signed)
[facility].  Inetta Fermoina reported for [facility] and verified prior to transport.

## 2013-12-18 NOTE — Progress Notes (Signed)
63 year old male pt admitted on involuntary basis. On admission, pt reports that [place] from Dr. Geanie CooleyLay and goes on to mention after a lengthy thought process about Depakote and how this was recently changed. Pt is oriented to person and time. Pt also reports that he lives with a Camila LiJeanetta Johnson but unable to ascertain the relationship at this time. Pt did speak about being in state hospitals in the past and spoke about being in [place] time and said it was due to him not being able to sleep. Pt rambling and thought process a bit difficult to follow. Pt denied any pain issues on admission and was cooperative during the process. Pt was oriented to unit, was provided with a meal and safety maintained.

## 2013-12-18 NOTE — ED Notes (Signed)
[facility]

## 2013-12-18 NOTE — Tx Team (Signed)
Initial Interdisciplinary Treatment Plan  PATIENT STRENGTHS: (choose at least two) Ability for insight General fund of knowledge  PATIENT STRESSORS: Medication change or noncompliance   PROBLEM LIST: Problem List/Patient Goals Date to be addressed Date deferred Reason deferred Estimated date of resolution  Auditory hallucinations 12/18/13     Disorganized thought process 12/18/13                                                DISCHARGE CRITERIA:  Ability to meet basic life and health needs Improved stabilization in mood, thinking, and/or behavior  PRELIMINARY DISCHARGE PLAN: Attend aftercare/continuing care group Return to previous living arrangement  PATIENT/FAMIILY INVOLVEMENT: This treatment plan has been presented to and reviewed with the patient, Dayle PointsRichard Stankiewicz, and/or family member, .  The patient and family have been given the opportunity to ask questions and make suggestions.  Rimsha Trembley, [place] 12/18/2013, 6:31 PM

## 2013-12-18 NOTE — ED Notes (Signed)
Pt. Remained calm and cooperative through the night. Pt. Ambulating in room and to bathroom. Pt. Read magazines and watched television but did not sleep.

## 2013-12-18 NOTE — ED Provider Notes (Signed)
BP 127/74  Pulse 54  Temp(Src) 97.5 F (36.4 C) (Oral)  Resp 16  Ht 5\' 11"  (1.803 m)  Wt 184 lb (83.462 kg)  BMI 25.67 kg/m2  SpO2 100% Labs reviewed Pt stable [facility]   Joya Gaskinsonald W Makenzey Nanni, MD 12/18/13 1410

## 2013-12-18 NOTE — ED Notes (Signed)
Accepting MD is Aikintao. [facility] is reviewing IVC paperwork.

## 2013-12-19 DIAGNOSIS — F200 Paranoid schizophrenia: Secondary | ICD-10-CM

## 2013-12-19 MED ORDER — DIVALPROEX SODIUM ER 250 MG PO TB24
250.0000 mg | ORAL_TABLET | Freq: Once | ORAL | Status: AC
  Administered 2013-12-19: 250 mg via ORAL
  Filled 2013-12-19 (×2): qty 1

## 2013-12-19 MED ORDER — ENSURE COMPLETE PO LIQD
237.0000 mL | Freq: Two times a day (BID) | ORAL | Status: DC
  Administered 2013-12-19 – 2013-12-25 (×11): 237 mL via ORAL

## 2013-12-19 MED ORDER — DIVALPROEX SODIUM ER 250 MG PO TB24
250.0000 mg | ORAL_TABLET | Freq: Two times a day (BID) | ORAL | Status: DC
  Administered 2013-12-19 – 2013-12-25 (×12): 250 mg via ORAL
  Filled 2013-12-19: qty 1
  Filled 2013-12-19: qty 6
  Filled 2013-12-19 (×15): qty 1
  Filled 2013-12-19: qty 6
  Filled 2013-12-19: qty 1

## 2013-12-19 NOTE — Progress Notes (Signed)
Patient ID: Kevin PointsRichard Fowler, male   DOB: [birth year], 63 y.o.   MRN: [id number] Psychoeducational Group Note  Date:  12/19/2013 Time:0915am  Group Topic/Focus:  Identifying Needs:   The focus of this group is to help patients identify their personal needs that have been historically problematic and identify healthy behaviors to address their needs.  Participation Level:  Active  Participation Quality:  disorganized  Affect:  Irritable  Cognitive:  Disorganized  Insight:  Off Topic  Engagement in Group:  Off Topic  Additional Comments:  Inventory group   Kevin Fowler, Kevin Fowler 12/19/2013,10:03 AM

## 2013-12-19 NOTE — [facility] Suicide Risk Assessment (Signed)
   Nursing information obtained from:    Demographic factors:    Current Mental Status:    Loss Factors:    Historical Factors:    Risk Reduction Factors:    Total Time spent with patient: 45 minutes  CLINICAL FACTORS:   Schizophrenia:   Paranoid or undifferentiated type  Psychiatric Specialty Exam: Physical Exam  ROS  Blood pressure 146/92, pulse 70, temperature 98 F (36.7 C), temperature source Oral, resp. rate 20, height 5\' 10"  (1.778 m), weight 144 lb (65.318 kg).Body mass index is 20.66 kg/(m^2).  General Appearance: Bizarre, Disheveled and Guarded  Eye Contact::  Minimal  Speech:  Garbled and Pressured  Volume:  Increased  Mood:  Irritable  Affect:  Constricted and Inappropriate  Thought Process:  Disorganized and Tangential  Orientation:  Other:  poor  Thought Content:  Delusions and Paranoid Ideation  Suicidal Thoughts:  No  Homicidal Thoughts:  No  Memory:  poor  Judgement:  Impaired  Insight:  Lacking  Psychomotor Activity:  Increased  Concentration:  Poor  Recall:  Poor  Fund of Knowledge:Limited  Language: Fair  Akathisia:  No  Handed:  Right  AIMS (if indicated):     Assets:  Housing  Sleep:  Number of Hours: 5.25   Musculoskeletal: Strength & Muscle Tone: Patient is thin built Gait & Station: normal Patient leans: N/A  COGNITIVE FEATURES THAT CONTRIBUTE TO RISK:  Polarized thinking Thought constriction (tunnel vision)    SUICIDE RISK:   Mild:  Suicidal ideation of limited frequency, intensity, duration, and specificity.  There are no identifiable plans, no associated intent, mild dysphoria and related symptoms, good self-control (both objective and subjective assessment), few other risk factors, and identifiable protective factors, including available and accessible social support.  PLAN OF CARE:  I certify that inpatient services furnished can reasonably be expected to improve the patient's condition.  Levii Hairfield T. 12/19/2013, 1:32 PM
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
## 2013-12-19 NOTE — [facility] Counselor (Signed)
CSW noted during group that patient was disorganized, religiously preoccupied, paranoid, exhibiting word salad.  It was decided that Psychosocial Assessment would be attempted at a later date.  Ambrose MantleMareida Grossman-Orr, LCSW 12/19/2013, 4:15 PM

## 2013-12-19 NOTE — [facility] Group Notes (Signed)
[facility] Group Notes:  (Clinical Social Work)  12/19/2013  11:00-11:45AM  Summary of Progress/Problems:   The main focus of today's process group was for the patient to identify ways in which they have in the past sabotaged their own recovery and reasons they may have done this/what they received from doing it.  We then worked to identify a specific plan to avoid doing this when discharged from the hospital for this admission.  The patient expressed no understanding of the group topic of Healthy Coping Skills, but engaged in word salad using names (rather than words) for a good portion of group.  When he started to answer another group member's question, he displayed mania once again and gave a litany of sentences that in no way were related to the question asked, nor were these sentences coherent and related to each other.  He was very angry when CSW attempted to redirect him each time.  Another patient asked the unrelated question of the difference between Satan, Lucifer and the devil, and this patient immediately, manically, and with ongoing odd pronunciations of words provided what sounded like a lecture on the topic.  He was angry when CSW interrupted him to inform the group that group time was over.  Type of Therapy:  Group Therapy - Process  Participation Level:  Active  Participation Quality:  Intrusive, Inattentive, Monopolizing and Resistant  Affect:  Flat, Labile and Manic  Cognitive:  Disorganized and Lacking  Insight:  None  Engagement in Therapy:  None  Modes of Intervention:  Clarification, Education, Exploration, Discussion  Ambrose MantleMareida Grossman-Orr, LCSW 12/19/2013, 12:35 PM

## 2013-12-19 NOTE — Progress Notes (Signed)
Patient ID: Kevin Fowler, male   DOB: [birth year], 63 y.o.   MRN: [id number] D. The patient has an anxious mood and affect. His speech is rapid and pressured. His thoughts are disorganized and tangential. When asked a direct question gives a rambling response that is difficult to understand.  A. Met with patient to assess. Encouraged to attend evening wrap up group. Administered HS medication. R. The patient attended evening wrap up group. He volunteered to speak first. However, he could not stay on topic and rambled on aggressively with flight of idea. His conversation could not be redirected. Denied suicidal/homicidal ideation. Denied a/v hallucinations. Compliant with medication.

## 2013-12-19 NOTE — Progress Notes (Signed)
Patient ID: Dayle PointsRichard Felling, male   DOB: [birth year], 63 y.o.   MRN: [id number] Psychoeducational Group Note  Date:  12/19/2013 [id number]  Group Topic/Focus:  Identifying Needs:   The focus of this group is to help patients identify their personal needs that have been historically problematic and identify healthy behaviors to address their needs.  Participation Level:  Active  Participation Quality:  Monopolizing and Redirectable  Affect:  Defensive  Cognitive:  Disorganized  Insight:  Distracting and Monopolizing  Engagement in Group:  Engineer, miningDistracting and Monopolizing  Additional Comments:  Coping skills.   Valente DavidWeaver, Jaycub Noorani Brooks 12/19/2013,10:27 AM

## 2013-12-19 NOTE — Progress Notes (Signed)
Adult Psychoeducational Group Note  Date:  12/19/2013 Time:  8:00 pm  Group Topic/Focus:  Wrap-Up Group:   The focus of this group is to help patients review their daily goal of treatment and discuss progress on daily workbooks.   Laural BenesJohnson, Wilbur [place] 12/19/2013, 9:51 PM

## 2013-12-19 NOTE — Progress Notes (Signed)
Patient ID: Kevin PointsRichard Scheller, male   DOB: 1951-07-05, 63 y.o.   MRN: [id number] 12-19-13 nursing shift note: D: he is a new admission arriving on 23-6-15.  this pt remains paranoid, disorganized,  delusional and incoherent. He can't converse about one topic at a time. He takes his medications and is cooperative. A: MD discontinued his Zyprexa and started him on Depakote. With encouragement from staff he attended his groups. He is not showing any adverse effects from his medications. He didn't fill out his inventory sheet. He denies any si/hi. RN will monitor and Q 15 min ck's continue.

## 2013-12-20 NOTE — Progress Notes (Signed)
Patient ID: Kevin Fowler, male   DOB: [birth year], 63 y.o.   MRN: [id number] Psychoeducational Group Note  Date:  12/20/2013 Time:  1000am  Group Topic/Focus:  Making Healthy Choices:   The focus of this group is to help patients identify negative/unhealthy choices they were using prior to admission and identify positive/healthier coping strategies to replace them upon discharge.  Participation Level:  Did Not Attend  Participation Quality:    Affect:  Insight:  Engagement in Group:  Additional Comments:  Inventory and healthy support systems   Kevin Fowler, Kevin Fowler 12/20/2013,10:17 AM

## 2013-12-20 NOTE — Progress Notes (Signed)
Adult Psychoeducational Group Note  Date:  12/20/2013 Time:  10:22 PM  Group Topic/Focus:  Wrap-Up Group:   The focus of this group is to help patients review their daily goal of treatment and discuss progress on daily workbooks.  Participation Level:  Minimal  Participation Quality:  Intrusive  Affect:  Labile  Cognitive:  Lacking  Insight: Limited  Engagement in Group:  Lacking  Modes of Intervention:  Support  Additional Comments:  Patient attended and participated in group tonight. Although the patient has disorganized thoughts he was able to expressed that he made his bed, brush is teeth and got a pencle today. He support system was his brother whom was his legal guardian.  Lita MainsFrancis, Adaora Mchaney [facility] 12/20/2013, 10:22 PM

## 2013-12-20 NOTE — Progress Notes (Signed)
Patient ID: Kevin Fowler, male   DOB: [birth year], 63 y.o.   MRN: [id number] 12-20-13 @ 1012 nursing shift note: D: Patient was able to complete inventory sheet with assistance.  No complaints of pain, no apparent effects from medications.  Pt. Requesting wash a cloth to clean himself.  Still having auditory hallucinations and rambling speech.  Pt. Able to obey instructions briefly, however is unable to maintain attention to conversation.  Pt. Denies suicidal or homicidal ideations.   Pt. States appetite is improving. He also is getting a nutritional supplement.  Patient did not attend group.  Actions:  Patient given medication without difficulty.  Patient encouraged to attend group therapy.   Response: Patient was able to take medication.  Patient did not attend group therapy today.

## 2013-12-20 NOTE — [facility] Counselor (Signed)
CSW attempted PSA with patient, but he remains too disorganized and pressured, rapid speech cannot be understood.  Kevin MantleMareida Grossman-Orr, LCSW 12/20/2013, 3:58 PM

## 2013-12-20 NOTE — [facility] Group Notes (Signed)
[facility] Group Notes:  (Clinical Social Work)  12/20/2013   11:15am-12:00pm  Summary of Progress/Problems:  The main focus of today's process group was to listen to a variety of genres of music and to identify that different types of music provoke different responses.  The patient then was able to identify personally what was soothing for them, as well as energizing.  The patient expressed understanding of concepts, as well as knowledge of how each type of music affected him and how this can be used at home as a wellness/recovery tool.  While he spoke frequently throughout group and much of what he said was comprehensible, much was also garbled and pressured.  He was more pleasant and on topic today.  Type of Therapy:  Music Therapy   Participation Level:  Active  Participation Quality:  Attentive and Sharing  Affect:  Blunted  Cognitive:  Disorganized  Insight:  Improving  Engagement in Therapy:  Engaged  Modes of Intervention:   Activity, Exploration  Ambrose MantleMareida Grossman-Orr, LCSW 12/20/2013, 12:30pm

## 2013-12-20 NOTE — Progress Notes (Signed)
[facility]  12/20/2013 1:04 PM Dayle PointsRichard Seyfried  MRN:  [id number] Subjective:  I don't know why I am here  Objective: Patient seen chart reviewed.  The patient remains very paranoid, delusional and disorganized.  However he was able to sleep a few hours.  He is taking Prolixin and Depakote.  He denies any side effects of medication.  He remains preoccupied with his own thinking, irrational and sometimes confused.  [facility].  He had limited participation in the groups.  He continues to have irritability, disorganized behavior and confusion.  Patient unable to provide a coherent history.  He is taking his medication.  He has no tremors or shakes. Diagnosis:   DSM5: Schizophrenia Disorders:  Delusional Disorder (297.1) Total Time spent with patient: 20 minutes  Axis I: Chronic Paranoid Schizophrenia Axis II: Deferred Axis III:  Past Medical History  Diagnosis Date  . Diabetes mellitus   . Hypertension   . Bipolar 1 disorder   . Schizo affective schizophrenia    Axis IV: other psychosocial or environmental problems, problems related to social environment and problems with primary support group  ADL's:  Intact  Sleep: Fair  Appetite:  Fair  Suicidal Ideation:  Plan:  None Intent:  None Means:  None Homicidal Ideation:  Plan:  None Intent:  None Means:  None AEB (as evidenced by):  Psychiatric Specialty Exam: Physical Exam  ROS  Blood pressure 135/87, pulse 83, temperature 97.7 F (36.5 C), temperature source Oral, resp. rate 16, height 5\' 10"  (1.778 m), weight 144 lb (65.318 kg).Body mass index is 20.66 kg/(m^2).  General Appearance: Disheveled and Guarded  Eye SolicitorContact::  Fair  Speech:  Pressured and Rambling  Volume:  Increased  Mood:  Irritable  Affect:  Inappropriate and Labile  Thought Process:  Circumstantial, Disorganized and Tangential  Orientation:  Other:  poor  Thought Content:  Delusions and  Paranoid Ideation  Suicidal Thoughts:  No  Homicidal Thoughts:  No  Memory:  Immediate;   Poor Recent;   Poor Remote;   Poor  Judgement:  Impaired  Insight:  Lacking  Psychomotor Activity:  Increased  Concentration:  Poor  Recall:  Poor  Fund of Knowledge:Limited  Language: Fair  Akathisia:  No  Handed:  Right  AIMS (if indicated):     Assets:  Housing  Sleep:  Number of Hours: 0.75   Musculoskeletal: Strength & Muscle Tone: Patient is thin built Gait & Station: normal Patient leans: N/A  Current Medications: Current Facility-Administered Medications  Medication Dose Route Frequency Provider Last Rate Last Dose  . acetaminophen (TYLENOL) tablet 650 mg  650 mg Oral Q6H PRN Nanine MeansJamison Lord, NP      . alum & mag hydroxide-simeth (MAALOX/MYLANTA) 200-200-20 MG/5ML suspension 30 mL  30 mL Oral Q4H PRN Nanine MeansJamison Lord, NP      . amLODipine (NORVASC) tablet 2.5 mg  2.5 mg Oral Daily Nanine MeansJamison Lord, NP   2.5 mg at 12/20/13 0757  . aspirin EC tablet 81 mg  81 mg Oral Daily Nanine MeansJamison Lord, NP   81 mg at 12/20/13 0756  . benztropine (COGENTIN) tablet 0.5 mg  0.5 mg Oral BID Nanine MeansJamison Lord, NP   0.5 mg at 12/20/13 0757  . divalproex (DEPAKOTE ER) 24 hr tablet 250 mg  250 mg Oral BID Cleotis NipperSyed T Deloyce Walthers, MD   250 mg at 12/20/13 0756  . feeding supplement (ENSURE COMPLETE) (ENSURE COMPLETE) liquid 237 mL  237 mL Oral BID  BM Cleotis Nipper, MD   237 mL at 12/20/13 1252  . fluPHENAZine (PROLIXIN) tablet 10 mg  10 mg Oral BID Nanine Means, NP   10 mg at 12/20/13 0756  . levothyroxine (SYNTHROID, LEVOTHROID) tablet 50 mcg  50 mcg Oral QAC breakfast Nanine Means, NP   50 mcg at 12/20/13 1610  . magnesium hydroxide (MILK OF MAGNESIA) suspension 30 mL  30 mL Oral Daily PRN Nanine Means, NP      . tamsulosin (FLOMAX) capsule 0.4 mg  0.4 mg Oral Daily Nanine Means, NP   0.4 mg at 12/20/13 0757    Lab Results: No results found for this or any previous visit (from the past 48 hour(s)).  Physical Findings: AIMS:  Facial and Oral Movements Muscles of Facial Expression: None, normal Lips and Perioral Area: None, normal Jaw: None, normal Tongue: None, normal,Extremity Movements Upper (arms, wrists, hands, fingers): None, normal Lower (legs, knees, ankles, toes): None, normal, Trunk Movements Neck, shoulders, hips: None, normal, Overall Severity Severity of abnormal movements (highest score from questions above): None, normal Incapacitation due to abnormal movements: None, normal Patient's awareness of abnormal movements (rate only patient's report): No Awareness, Dental Status Current problems with teeth and/or dentures?: Yes Does patient usually wear dentures?: No  CIWA:    COWS:     Treatment Plan Summary: Daily contact with patient to assess and evaluate symptoms and progress in treatment Medication management Continue Depakote and Prolixin.  Plan: 1  Admit for crisis management and stabilization. 2.  Medication management to reduce symptoms to baseline and improved the patient's overall level of functioning.  Closely monitor the side effects, efficacy and therapeutic response of medication. 3.  Treat health problem as indicated. 4.  Developed treatment plan to decrease the risk of relapse upon discharge and to reduce the need for readmission. 5.  Psychosocial education regarding relapse prevention in self-care. 6.  Healthcare followup as needed for medical problems and called consults as indicated.   7.  Increase collateral information. 8.  Restart home medication where appropriate 9. Encouraged to participate and verbalize into group milieu therapy.   Medical Decision Making Problem Points:  Established problem, stable/improving (1) and Review of psycho-social stressors (1) Data Points:  Review or order clinical lab tests (1) Review and summation of old records (2) Review of medication regiment & side effects (2)  I certify that inpatient services furnished can reasonably be expected  to improve the patient's condition.   Yug Loria T. 12/20/2013, 1:04 PM

## 2013-12-20 NOTE — Progress Notes (Addendum)
Patient ID: Kevin Fowler, male   DOB: [birth year], 63 y.o.   MRN: [id number] D. The patient is disorganized with rapid, pressured, incoherent speech. Easily distracted off task and unaware when he is being intrusive or disruptive. Spilled coffee all over himself and the floor and had to leave group to change clothes. A. Frequent redirection given throughout group to help keep on topic of the group. R. Unable to respond appropriately to redirection.

## 2013-12-20 NOTE — Progress Notes (Signed)
Patient ID: Kevin PointsRichard Kwong, male   DOB: [birth year], 63 y.o.   MRN: [id number] 12/20/2013 1100  Data:  Pt. Was able to fill out self inventory sheet with assistance.  Patient slept poorly last night and states appetite is improving.  Patient continues to have auditory hallucinations and rambles on during conversation.  Patient is unable to concentrate and stay on topic with conversation.  Seen often pacing in halls talking to himself.  Action:  Patient given medications as prescribed.  Patient encouraged to attend group sessions.  Patient monitored by staff Q 15 minutes.  Response:  Patient did take medications without difficulty.  Patient did not attend group sessions. Staff will continue to monitor patient Q 15 minutes.

## 2013-12-21 DIAGNOSIS — F259 Schizoaffective disorder, unspecified: Principal | ICD-10-CM

## 2013-12-21 LAB — BASIC METABOLIC PANEL
BUN: 20 mg/dL (ref 6–23)
CO2: 23 mEq/L (ref 19–32)
Calcium: 9 mg/dL (ref 8.4–10.5)
Chloride: 102 mEq/L (ref 96–112)
Creatinine, Ser: 1.21 mg/dL (ref 0.50–1.35)
GFR calc Af Amer: 71 mL/min — ABNORMAL LOW (ref >90)
GFR calc non Af Amer: 62 mL/min — ABNORMAL LOW (ref >90)
Glucose, Bld: 100 mg/dL — ABNORMAL HIGH (ref 70–99)
Potassium: 4.4 mEq/L (ref 3.7–5.3)
Sodium: 139 mEq/L (ref 137–147)

## 2013-12-21 MED ORDER — TRIHEXYPHENIDYL HCL 5 MG PO TABS
5.0000 mg | ORAL_TABLET | Freq: Two times a day (BID) | ORAL | Status: DC
  Administered 2013-12-21 – 2013-12-25 (×8): 5 mg via ORAL
  Filled 2013-12-21 (×5): qty 1
  Filled 2013-12-21: qty 6
  Filled 2013-12-21 (×4): qty 1
  Filled 2013-12-21: qty 6
  Filled 2013-12-21: qty 1

## 2013-12-21 NOTE — Care Management Utilization Note (Signed)
   Per State Regulation 482.30  This chart was reviewed for necessity with respect to the patient's Admission/ Duration of stay.  Next review date: 12/24/13  Livio Ledwith Morrison RN, BSN 

## 2013-12-21 NOTE — Progress Notes (Signed)
D: Pt presents anxious this morning, flight of ideas, disorganized thoughts, poor insight, crying spells and paranoid. Pt denies SI/HI. Pt stated, "I won't hurt the devil himself". Pt repeatedly voiced [facility] and how he is afraid. Pt is silly and childlike at times. Pt asked writer, "do you drink" and then began to laugh inappropriately stating, "Well I do". A: Medications administered as ordered per MD. Verbal support given. Pt encouraged to attend groups. 15 minute checks performed for safety. R: Pt safety maintained.

## 2013-12-21 NOTE — Tx Team (Signed)
  Interdisciplinary Treatment Plan Update   Date Reviewed:  12/21/2013  Time Reviewed:  8:30 AM  Progress in Treatment:   Attending groups: Yes Participating in groups: Yes Taking medication as prescribed: Yes  Tolerating medication: Yes Family/Significant other contact made: No  Patient understands diagnosis: No  Limited insight  Discussing patient identified problems/goals with staff: Yes  See initial care plan. Medical problems stabilized or resolved: Yes Denies suicidal/homicidal ideation: Yes  In tx team Patient has not harmed self or others: Yes  For review of initial/current patient goals, please see plan of care.  Estimated Length of Stay:  4-5 days  Reason for Continuation of Hospitalization: Mania Medication stabilization Other; describe Mood lability  New Problems/Goals identified:  N/A  Discharge Plan or Barriers:   return home, follow up outpt  Additional Comments:  The patient is still disorganized with rapid, pressured, speech. His speech is peppered with word salad and flight of ideas. He gets easily agitated when he speaks. Obsessed about a pencil that he lost on the unit.   Attendees:  Signature: Thedore MinsMojeed Akintayo, MD 12/21/2013 8:30 AM   Signature: Richelle Itood Jason Frisbee, LCSW 12/21/2013 8:30 AM  Signature: Fransisca KaufmannLaura Davis, NP 12/21/2013 8:30 AM  Signature: Joslyn Devonaroline Beaudry, RN 12/21/2013 8:30 AM  Signature: Liborio NixonPatrice White, RN 12/21/2013 8:30 AM  Signature:  12/21/2013 8:30 AM  Signature:   12/21/2013 8:30 AM  Signature:    Signature:    Signature:    Signature:    Signature:    Signature:      Scribe for Treatment Team:   Richelle Itood Jacek Colson, LCSW  12/21/2013 8:30 AM

## 2013-12-21 NOTE — [facility] Group Notes (Signed)
[facility] LCSW Group Therapy  12/21/2013 1:15 pm  Type of Therapy: Process Group Therapy  Participation Level:  Active  Participation Quality:  Appropriate  Affect:  Flat  Cognitive:  Oriented  Insight:  Improving  Engagement in Group:  Limited  Engagement in Therapy:  Limited  Modes of Intervention:  Activity, Clarification, Education, Problem-solving and Support  Summary of Progress/Problems: Today's group addressed the issue of overcoming obstacles.  Patients were asked to identify their biggest obstacle post d/c that stands in the way of their on-going success, and then problem solve as to how to manage this.  Drae stayed for the entire group.  He started out by talking about his challenge with "mean people," which may have been prompted by my dismissal of another group member who was angry and disruptive.  There was little substance behind what he was expressing, and we moved on.  He periodically would blurt out during group, and it was hard to redirect him, but others did a good job of ignoring him and going on.  Daryel Geraldorth, Evelio Rueda B 12/21/2013   3:08 PM

## 2013-12-21 NOTE — Progress Notes (Signed)
Patient ID: Kevin Fowler, male   DOB: 01/14/1951, 63 y.o.   MRN: [id number] D. The patient is still disorganized with rapid, pressured, speech. His speech is peppered with word salad and flight of ideas. He gets easily agitated when he speaks. Obsessed about a pencil that he lost on the unit. A. Encouraged to attend evening group. Redirect frequently to help stay on topic. R. Attended evening group. Was not able to stay on topic and spoke out frequently when others were speaking. Stated that his brother is his guardian.

## 2013-12-21 NOTE — Progress Notes (Signed)
Patient ID: Kevin Fowler Hilyard, male   DOB: October 18, 1951, 63 y.o.   MRN: [id number] [facility]  12/21/2013 10:23 AM Kevin Fowler Enrique  MRN:  [id number] Subjective: " I want to go back home, I have been seeing my doctor, Dr. Geanie CooleyLay for my Depakote and Prolixin.''  Objective: Patient seen and  Chart is reviewed.  The patient has poor insight into his problem, unable to understand why he has to be hospitalized. He is emotionally fragile, depressed, delusional, psychotic,talks to himself and has been having crying episodes. His speech and behavior is bizarre and disorganized. So far, he has been compliant with his medications and has not reported any adverse reactions.   Diagnosis:   DSM5: Schizophrenia Disorders:  Delusional Disorder (297.1) Total Time spent with patient: 20 minutes  Axis I: Schizoaffective disorder Axis II: Deferred Axis III:  Past Medical History  Diagnosis Date  . Diabetes mellitus   . Hypertension    Axis IV: other psychosocial or environmental problems, problems related to social environment and problems with primary support group  ADL's:  Intact  Sleep: Fair  Appetite:  Fair  Suicidal Ideation:  Plan:  None Intent:  None Means:  None Homicidal Ideation:  Plan:  None Intent:  None Means:  None AEB (as evidenced by):  Psychiatric Specialty Exam: Physical Exam  Review of Systems  Constitutional: Negative.   HENT: Negative.   Eyes: Negative.   Respiratory: Negative.   Cardiovascular: Negative.   Gastrointestinal: Negative.   Genitourinary: Negative.   Musculoskeletal: Negative.   Skin: Negative.   Neurological: Negative.   Endo/Heme/Allergies: Negative.   Psychiatric/Behavioral: Positive for hallucinations. The patient is nervous/anxious and has insomnia.     Blood pressure 129/82, pulse 80, temperature 98.3 F (36.8 C), temperature source Oral, resp. rate 16, height 5\' 10"  (1.778 m), weight 65.318 kg (144 lb).Body mass index is 20.66 kg/(m^2).   General Appearance: Disheveled and Guarded  Eye SolicitorContact::  Fair  Speech:  Pressured and Rambling  Volume:  Increased  Mood:  Irritable  Affect:  Inappropriate and Labile  Thought Process:  Circumstantial, Disorganized and Tangential  Orientation:  Other:  poor  Thought Content:  Delusions and Paranoid Ideation  Suicidal Thoughts:  No  Homicidal Thoughts:  No  Memory:  Immediate;   Poor Recent;   Poor Remote;   Poor  Judgement:  Impaired  Insight:  Lacking  Psychomotor Activity:  Increased  Concentration:  Poor  Recall:  Poor  Fund of Knowledge:Limited  Language: Fair  Akathisia:  No  Handed:  Right  AIMS (if indicated):     Assets:  Housing  Sleep:  Number of Hours: 5   Musculoskeletal: Strength & Muscle Tone: Patient is thin built Gait & Station: normal Patient leans: N/A  Current Medications: Current Facility-Administered Medications  Medication Dose Route Frequency Provider Last Rate Last Dose  . acetaminophen (TYLENOL) tablet 650 mg  650 mg Oral Q6H PRN Nanine MeansJamison Lord, NP      . alum & mag hydroxide-simeth (MAALOX/MYLANTA) 200-200-20 MG/5ML suspension 30 mL  30 mL Oral Q4H PRN Nanine MeansJamison Lord, NP      . amLODipine (NORVASC) tablet 2.5 mg  2.5 mg Oral Daily Nanine MeansJamison Lord, NP   2.5 mg at 12/21/13 0836  . aspirin EC tablet 81 mg  81 mg Oral Daily Nanine MeansJamison Lord, NP   81 mg at 12/21/13 0835  . divalproex (DEPAKOTE ER) 24 hr tablet 250 mg  250 mg Oral BID Cleotis NipperSyed T Arfeen, MD   250  mg at 12/21/13 0835  . feeding supplement (ENSURE COMPLETE) (ENSURE COMPLETE) liquid 237 mL  237 mL Oral BID BM Cleotis Nipper, MD   237 mL at 12/20/13 1252  . fluPHENAZine (PROLIXIN) tablet 10 mg  10 mg Oral BID Nanine Means, NP   10 mg at 12/21/13 0836  . levothyroxine (SYNTHROID, LEVOTHROID) tablet 50 mcg  50 mcg Oral QAC breakfast Nanine Means, NP   50 mcg at 12/21/13 1610  . magnesium hydroxide (MILK OF MAGNESIA) suspension 30 mL  30 mL Oral Daily PRN Nanine Means, NP      . tamsulosin (FLOMAX)  capsule 0.4 mg  0.4 mg Oral Daily Nanine Means, NP   0.4 mg at 12/20/13 0757  . trihexyphenidyl (ARTANE) tablet 5 mg  5 mg Oral BID WC Lucian Baswell        Lab Results: No results found for this or any previous visit (from the past 48 hour(s)).  Physical Findings: AIMS: Facial and Oral Movements Muscles of Facial Expression: None, normal Lips and Perioral Area: None, normal Jaw: None, normal Tongue: None, normal,Extremity Movements Upper (arms, wrists, hands, fingers): None, normal Lower (legs, knees, ankles, toes): None, normal, Trunk Movements Neck, shoulders, hips: None, normal, Overall Severity Severity of abnormal movements (highest score from questions above): None, normal Incapacitation due to abnormal movements: None, normal Patient's awareness of abnormal movements (rate only patient's report): No Awareness, Dental Status Current problems with teeth and/or dentures?: Yes Does patient usually wear dentures?: No  CIWA:    COWS:     Treatment Plan Summary: Daily contact with patient to assess and evaluate symptoms and progress in treatment Medication management Plan: 1  Admit for crisis management and stabilization. 2.  Medication management to reduce symptoms to baseline and improved the patient's overall level of functioning.  Closely monitor the side effects, efficacy and therapeutic response of medication. Continue Depakote 250mg  bid for mood stabilization, Prolixin 10mg  bid for psychosis/delusions and Artane 5mg  bid for EPS prevention. 3.  Treat health problem as indicated. 4.  Developed treatment plan to decrease the risk of relapse upon discharge and to reduce the need for readmission. 5.  Psychosocial education regarding relapse prevention in self-care. 6.  Healthcare followup as needed for medical problems and called consults as indicated.   7. Encouraged to participate and verbalize into group milieu therapy.   Medical Decision Making Problem Points:   Established problem, improving (1) and Review of psycho-social stressors (1) Data Points:  Review or order clinical lab tests (1) Review and summation of old records (2) Review of medication regiment & side effects (2)  I certify that inpatient services furnished can reasonably be expected to improve the patient's condition.   Thedore Mins, MD 12/21/2013, 10:23 AM

## 2013-12-21 NOTE — [facility] Group Notes (Signed)
[facility]   12/21/2013 10:52 AM  Participation Quality:  Engaged  Mood/Affect:  Excited and Labile Intrusive  Depression Rating:  denies  Anxiety Rating:  7  Thoughts of Suicide:  No Will you contract for safety?   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  Went to the store last week when it was cold dressed in shorts and a t-shirt, and people became concerned.  Has been hospitalized "Innumerable times" but not for 8 years.  Is taking Prolixin and Depakote.  Camila LiJeanetta Johnson is his "caretaker."  Transportation Means: see above  Supports: see above  Kiribatiorth, Thereasa Distanceodney B

## 2013-12-22 LAB — HEMOGLOBIN A1C
HEMOGLOBIN A1C: 5.1 % (ref <5.7)
MEAN PLASMA GLUCOSE: 100 mg/dL (ref <117)

## 2013-12-22 MED ORDER — OLANZAPINE 5 MG PO TBDP
ORAL_TABLET | ORAL | Status: AC
  Filled 2013-12-22: qty 1

## 2013-12-22 MED ORDER — OLANZAPINE 5 MG PO TBDP
5.0000 mg | ORAL_TABLET | Freq: Three times a day (TID) | ORAL | Status: DC | PRN
  Administered 2013-12-22 – 2013-12-25 (×5): 5 mg via ORAL
  Filled 2013-12-22 (×4): qty 1

## 2013-12-22 NOTE — Progress Notes (Signed)
Patient ID: Kevin Fowler, male   DOB: Jan 26, 1951, 63 y.o.   MRN: [id number] [facility] Progress Note  12/22/2013 1:05 PM Kevin PointsRichard Fowler  MRN:  [id number] Subjective:  Patient stated "Do you need to tape record me? My life is boring. But I am really smart. I can see you are too."   Objective:  Patient remains delusional, psychotic and easily agitated. Prior to entering patient's room this morning for assessment he was heard talking loudly to himself. Patient continues to have very disorganized thought process with pressured speech and flight of ideas, which makes his conversation difficult to follow. During our conversation today the patient randomly started naming various cities and states of the Kevin Fowler needing to be redirected to another question. Patient denies hearing voices but has been observed on the hall talking to himself. His roommate reported that the patient had a bowel movement on the floor of the room. Patient began laughing when writer asked about his event stating "I guess somebody scared it out of me." He denies any symptoms of GI distress or trouble.  His speech and behavior is bizarre and disorganized. So far, he has been compliant with his medications and has not reported any adverse reactions.  Patient required a dose of Zyprexa Zydis this morning for acute agitation as he was reported to be yelling and cursing in the hallway.   Diagnosis:   DSM5: Schizophrenia Disorders:  Delusional Disorder (297.1) Total Time spent with patient: 20 minutes  Axis I: Schizoaffective disorder Axis II: Deferred Axis III:  Past Medical History  Diagnosis Date  . Diabetes mellitus   . Hypertension    Axis IV: other psychosocial or environmental problems, problems related to social environment and problems with primary support group Axis V: 41-50 Serious Symptoms   ADL's:  Intact  Sleep: Poor  Appetite:  Fair  Suicidal Ideation:  Plan:  None Intent:  None Means:  None Homicidal Ideation:   Plan:  None Intent:  None Means:  None AEB (as evidenced by):  Psychiatric Specialty Exam: Physical Exam  Review of Systems  Constitutional: Negative.   HENT: Negative.   Eyes: Negative.   Respiratory: Negative.   Cardiovascular: Negative.   Gastrointestinal: Negative.  Negative for heartburn, nausea, vomiting, abdominal pain, diarrhea, constipation, blood in stool and melena.  Genitourinary: Negative.   Musculoskeletal: Negative.   Skin: Negative.   Neurological: Negative.   Endo/Heme/Allergies: Negative.   Psychiatric/Behavioral: Positive for hallucinations. Negative for depression, suicidal ideas, memory loss and substance abuse. The patient is nervous/anxious and has insomnia.     Blood pressure 128/82, pulse 89, temperature 97.4 F (36.3 C), temperature source Oral, resp. rate 20, height 5\' 10"  (1.778 m), weight 65.318 kg (144 lb).Body mass index is 20.66 kg/(m^2).  General Appearance: Disheveled and Guarded  Eye SolicitorContact::  Fair  Speech:  Pressured and Rambling  Volume:  Increased  Mood:  Irritable  Affect:  Inappropriate and Labile  Thought Process:  Circumstantial, Disorganized and Tangential  Orientation:  Other:  poor  Thought Content:  Delusions and Paranoid Ideation  Suicidal Thoughts:  No  Homicidal Thoughts:  No  Memory:  Immediate;   Poor Recent;   Poor Remote;   Poor  Judgement:  Impaired  Insight:  Lacking  Psychomotor Activity:  Increased  Concentration:  Poor  Recall:  Poor  Fund of Knowledge:Limited  Language: Fair  Akathisia:  No  Handed:  Right  AIMS (if indicated):     Assets:  Housing  Sleep:  Number  of Hours: 3   Musculoskeletal: Strength & Muscle Tone: Patient is thin built Gait & Station: normal Patient leans: N/A  Current Medications: Current Facility-Administered Medications  Medication Dose Route Frequency Provider Last Rate Last Dose  . acetaminophen (TYLENOL) tablet 650 mg  650 mg Oral Q6H PRN Waylan Boga, NP      . alum &  mag hydroxide-simeth (MAALOX/MYLANTA) 200-200-20 MG/5ML suspension 30 mL  30 mL Oral Q4H PRN Waylan Boga, NP      . amLODipine (NORVASC) tablet 2.5 mg  2.5 mg Oral Daily Waylan Boga, NP   2.5 mg at 12/22/13 0801  . aspirin EC tablet 81 mg  81 mg Oral Daily Waylan Boga, NP   81 mg at 12/22/13 0801  . divalproex (DEPAKOTE ER) 24 hr tablet 250 mg  250 mg Oral BID Kathlee Nations, MD   250 mg at 12/22/13 0802  . feeding supplement (ENSURE COMPLETE) (ENSURE COMPLETE) liquid 237 mL  237 mL Oral BID BM Kathlee Nations, MD   237 mL at 12/22/13 0804  . fluPHENAZine (PROLIXIN) tablet 10 mg  10 mg Oral BID Waylan Boga, NP   10 mg at 12/22/13 0801  . levothyroxine (SYNTHROID, LEVOTHROID) tablet 50 mcg  50 mcg Oral QAC breakfast Waylan Boga, NP   50 mcg at 12/22/13 [phone]  . magnesium hydroxide (MILK OF MAGNESIA) suspension 30 mL  30 mL Oral Daily PRN Waylan Boga, NP      . OLANZapine zydis (ZYPREXA) 5 MG disintegrating tablet           . OLANZapine zydis (ZYPREXA) disintegrating tablet 5 mg  5 mg Oral Q8H PRN Elmarie Shiley, NP   5 mg at 12/22/13 1209  . tamsulosin (FLOMAX) capsule 0.4 mg  0.4 mg Oral Daily Waylan Boga, NP   0.4 mg at 12/22/13 0930  . trihexyphenidyl (ARTANE) tablet 5 mg  5 mg Oral BID WC Morena Mckissack   5 mg at 12/22/13 0802    Lab Results:  Results for orders placed during the hospital encounter of 12/18/13 (from the past 48 hour(s))  BASIC METABOLIC PANEL     Status: Abnormal   Collection Time    12/21/13  7:30 PM      Result Value Range   Sodium 139  137 - 147 mEq/L   Potassium 4.4  3.7 - 5.3 mEq/L   Chloride 102  96 - 112 mEq/L   CO2 23  19 - 32 mEq/L   Glucose, Bld 100 (*) 70 - 99 mg/dL   BUN 20  6 - 23 mg/dL   Creatinine, Ser 1.21  0.50 - 1.35 mg/dL   Calcium 9.0  8.4 - 10.5 mg/dL   GFR calc non Af Amer 62 (*) >90 mL/min   GFR calc Af Amer 71 (*) >90 mL/min   Comment: (NOTE)     The eGFR has been calculated using the CKD EPI equation.     This calculation has not been  validated in all clinical situations.     eGFR's persistently <90 mL/min signify possible Chronic Kidney     Disease.     Performed at [facility]     Status: None   Collection Time    12/21/13  7:30 PM      Result Value Range   Hemoglobin A1C 5.1  <5.7 %   Comment: (NOTE)  According to the ADA Clinical Practice Recommendations for 2011, when     HbA1c is used as a screening test:      >=6.5%   Diagnostic of Diabetes Mellitus               (if abnormal result is confirmed)     5.7-6.4%   Increased risk of developing Diabetes Mellitus     References:Diagnosis and Classification of Diabetes Mellitus,Diabetes     EOFH,2197,58(ITGPQ 1):S62-S69 and Standards of Medical Care in             Diabetes - 2011,Diabetes DIYM,4158,30 (Suppl 1):S11-S61.   Mean Plasma Glucose 100  <117 mg/dL   Comment: Performed at [facility]    Physical Findings: AIMS: Facial and Oral Movements Muscles of Facial Expression: None, normal Lips and Perioral Area: None, normal Jaw: None, normal Tongue: None, normal,Extremity Movements Upper (arms, wrists, hands, fingers): None, normal Lower (legs, knees, ankles, toes): None, normal, Trunk Movements Neck, shoulders, hips: None, normal, Overall Severity Severity of abnormal movements (highest score from questions above): None, normal Incapacitation due to abnormal movements: None, normal Patient's awareness of abnormal movements (rate only patient's report): No Awareness, Dental Status Current problems with teeth and/or dentures?: Yes Does patient usually wear dentures?: No  CIWA:    COWS:     Treatment Plan Summary: Daily contact with patient to assess and evaluate symptoms and progress in treatment Medication management Plan: 1  Continue crisis management and stabilization. 2.  Medication management to reduce symptoms to baseline and  improved the patient's overall level of functioning.  Closely monitor the side effects, efficacy and therapeutic response of medication. Continue Depakote 250mg  bid for mood stabilization, Prolixin 10mg  bid for psychosis/delusions and Artane 5mg  bid for EPS prevention. Start Zyprexa Zydis 5 mg every eight hours prn acute agitation.  3.  Treat health problem as indicated. Review of Hemoglobin A1C of 5.1 shows patient is in the normal range, which does not warrant treatment.  4.  Developed treatment plan to decrease the risk of relapse upon discharge and to reduce the need for readmission. 5.  Psychosocial education regarding relapse prevention in self-care. 6.  Healthcare followup as needed for medical problems and called consults as indicated.  Continue home medications of Synthroid 50 mcg for hypothyroidism, Norvasc 2.5 mg daily for hypertension, Flomax 0.4 mg daily for BPH symptoms.  7. Encouraged to participate and verbalize into group milieu therapy.  Medical Decision Making Problem Fowler:  Established problem, worsening (2) and Review of psycho-social stressors (1) Data Fowler:  Order Aims Assessment (2) Review or order clinical lab tests (1) Review of medication regiment & side effects (2)  I certify that inpatient services furnished can reasonably be expected to improve the patient's condition.   Elmarie Shiley, NP-C 12/22/2013, 1:05 PM  Patient seen, evaluated and I agree with notes by Nurse Practitioner. Corena Pilgrim, MD

## 2013-12-22 NOTE — [facility] Group Notes (Signed)
[facility] LCSW Group Therapy  12/22/2013 , 12:06 PM   Type of Therapy:  Group Therapy  Did not attend  Summary of Progress/Problems: Today's group focused on the term Diagnosis.  Participants were asked to define the term, and then pronounce whether it is a negative, positive or neutral term.  Kevin Fowler, Kevin Fowler 12/22/2013 , 12:06 PM
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
## 2013-12-22 NOTE — [facility] Group Notes (Signed)
[facility] Group Notes:  (Nursing/MHT/Case Management/Adjunct)  Date:  12/22/2013  Time: 0900 am  Type of Therapy:  Nurse Education  Participation Level:  Active  Participation Quality:  Appropriate  Affect:  Appropriate  Cognitive:  Disorganized  Insight:  Limited  Engagement in Group:  Distracting, Lacking and Limited  Modes of Intervention:  Education  Summary of Progress/Problems: Pt disruptive at times but easy to redirect.  Rilda Bulls L 12/22/2013, 9:49 AM

## 2013-12-22 NOTE — Progress Notes (Signed)
Adult Psychoeducational Group Note  Date:  12/22/2013 Time:  8:00 pm  Group Topic/Focus:  Wrap-Up Group:   The focus of this group is to help patients review their daily goal of treatment and discuss progress on daily workbooks.  Participation Level:  Active  Participation Quality:  Appropriate and Sharing  Affect:  Appropriate  Cognitive:  Appropriate  Insight: Appropriate  Engagement in Group:  Engaged  Modes of Intervention:  Discussion, Education, Socialization and Support  Additional Comments:  Pt stated that he had a good day and ate a good breakfast. Pt stated that it has been a peaceful day. Pt stated that he likes adding numbers and reading about Armeniahina.   Laural BenesJohnson, Lillyn Wieczorek 12/22/2013, 8:55 PM

## 2013-12-22 NOTE — Progress Notes (Addendum)
D: Pt presents with disorganized thoughts, tangential speech, flight of ideas, and delusions. Pt has poor hygiene. Pt roommate reported that the pt "shit" on the floor and he (pt roommate) stepped in it. Pt denies SI/HI/AVH. Pt stated  he would not hurt anyone, because he is a smart guy, not dumb. A: Medications administered as ordered per MD. Verbal support given. Pt encouraged to attend groups. 15 minute checks performed for safety. R: Pt safety maintained. Pt anxious this morning. Pt has some mild confusion.   Pt agitated, talking loud to himself and cursing. Pt anxious,fidgety and pacing. Pt offered prn zyprexa to help with agitation. Pt willing to take prn med. Med given @ 1210 pm.

## 2013-12-22 NOTE — Clinical Social Work Note (Signed)
As yesterday, unable to do PSA today due to mood instability and bizarre, disorganized thoughts.

## 2013-12-22 NOTE — Progress Notes (Signed)
D   Pt is quiet and cooperative   He keeps to himself but is seen in the dayroom watching tv occasionally  He requested an ensure this evening and said he would not drink it so fast    His speech is rapid and somewhat disconnected  A   Verbal support given  Medications administered and effectiveness monitored   Q 15 min checks R   Pt safe at present

## 2013-12-22 NOTE — Progress Notes (Signed)
D  Pt is pleasant on approach   His speech is difficult to understand at times  He is anxious and fidgety   He is active in groups  His interaction is limited but appropriate    A   Verbal support given   Medications offered and monitor for effectiveness of same   Q 15 min check R   Pt safe at present

## 2013-12-23 NOTE — [facility] Group Notes (Signed)
[facility]  12/23/2013  1:55 PM  Type of Therapy:  [facility] Presentation   Participation Level:  Minimal  Participation Quality:  Attentive  Affect:  Appropriate  Cognitive:  Appropriate  Insight:  Lacking  Engagement in Therapy:  Poor  Modes of Intervention:  Discussion, Education and Socialization   Summary of Progress/Problems:  Onalee HuaDavid from [facility] [employer] [facility] came to present his recovery story and play the guitar.  Reyli came to group for the last 20 minutes when the speaker played his guitar.  Looked at the [facility] brochure and the telephone book.  He did not engage with speaker.    Simona Huhina Yang   12/23/2013  1:55 PM

## 2013-12-23 NOTE — Progress Notes (Signed)
Patient ID: Kevin Fowler, male   DOB: [birth year], 63 y.o.   MRN: [id number] D: Patient states "he is doing well". Pt is incoherent and difficult to understand. Pt is unable to stay focus on a topic. Pt thought process is still bizzare and disorganized. Pt denies suicidal /homicidal ideation intent and plan. Pt denies auditory and visual hallucination. Pt attended evening wrap up group and engaged in discussion. Pt denies any needs or concerns. Cooperative with assessment. No acute distressed noted at this time.   A: Met with pt 1:1. Medications administered as prescribed. Writer encouraged pt to discuss feelings. Pt encouraged to come to staff with any questions or concerns.   R: Patient remains safe. He is complaint with medications and denies any adverse reaction. Continue current POC.

## 2013-12-23 NOTE — Progress Notes (Signed)
D: Pt presents anxious this morning, fidgety, rapid, pressured speech, and flight of idea. Pt has disorganized thoughts and remains delusional. Pt difficult to understand d/t not being able to stay on topic and focus. Pt has mild confusion and have to be redirected by staff. Pt continues to talk to himself this morning. Pt denies any SI/HI/AVH. Pt compliant with taking meds and attending groups. A: Medications administered as ordered per MD. Verbal support given. Pt encouraged to attend groups. 15 minute checks performed for safety, R: Pt safety maintained at this time.

## 2013-12-23 NOTE — [facility] Suicide Risk Assessment (Signed)
[facility] INPATIENT:  Family/Significant Other Suicide Prevention Education  Suicide Prevention Education:  Education Completed; Camila LiJeanetta Johnson, caregiver/friend, [phone] has been identified by the patient as the family member/significant other with whom the patient will be residing, and identified as the person(s) who will aid the patient in the event of a mental health crisis (suicidal ideations/suicide attempt).  With written consent from the patient, the family member/significant other has been provided the following suicide prevention education, prior to the and/or following the discharge of the patient.  The suicide prevention education provided includes the following:  Suicide risk factors  Suicide prevention and interventions  National Suicide Hotline telephone number  [facility] assessment telephone number  [facility] Emergency Assistance 911  [facility] and/or Residential Mobile Crisis Unit telephone number  Request made of family/significant other to:  Remove weapons (e.g., guns, rifles, knives), all items previously/currently identified as safety concern.    Remove drugs/medications (over-the-counter, prescriptions, illicit drugs), all items previously/currently identified as a safety concern.  The family member/significant other verbalizes understanding of the suicide prevention education information provided.  The family member/significant other agrees to remove the items of safety concern listed above.  Daryel Geraldorth, Livingston Denner B 12/23/2013, 1:48 PM

## 2013-12-23 NOTE — Progress Notes (Signed)
Patient ID: Kevin Fowler, male   DOB: [birth year], 63 y.o.   MRN: [id number] [facility] MD Progress Note  12/23/2013 10:33 AM Jeptha Hinnenkamp  MRN:  [id number] Subjective: " I think people are out to kill me, I am feeling very depressed.''  Objective: Patient thoughts process remains bizarre and disorganized. He reports that he has been  emotionally disturbed since 7th grade. Today, he reports that he is depressed, hearing voices, having trouble sleeping and has  a fixed delusion that people want to kill him. He has been observed by the staffs to be talking to himself as if responding to internal stimuli. However, patient has been compliant with his medications and has not endorsed any adverse reactions. Diagnosis:   DSM5: Schizophrenia Disorders:  Delusional Disorder (297.1) Total Time spent with patient: 20 minutes  Axis I: Schizoaffective disorder Axis II: Deferred Axis III:  Past Medical History  Diagnosis Date  . Diabetes mellitus   . Hypertension    Axis IV: other psychosocial or environmental problems, problems related to social environment and problems with primary support group  ADL's:  Intact  Sleep: Fair  Appetite:  Fair  Suicidal Ideation:  Plan:  None Intent:  None Means:  None Homicidal Ideation:  Plan:  None Intent:  None Means:  None AEB (as evidenced by):  Psychiatric Specialty Exam: Physical Exam  Review of Systems  Constitutional: Negative.   HENT: Negative.   Eyes: Negative.   Respiratory: Negative.   Cardiovascular: Negative.   Gastrointestinal: Negative.   Genitourinary: Negative.   Musculoskeletal: Negative.   Skin: Negative.   Neurological: Negative.   Endo/Heme/Allergies: Negative.   Psychiatric/Behavioral: Positive for hallucinations. The patient is nervous/anxious and has insomnia.     Blood pressure 133/77, pulse 59, temperature 97.9 F (36.6 C), temperature source Oral, resp. rate 20, height $RemoveBe'5\' 10"'OdbzNsrNN$  (1.778 m), weight 65.318 kg (144 lb).Body mass  index is 20.66 kg/(m^2).  General Appearance: Disheveled and Guarded  Eye Sport and exercise psychologist::  Fair  Speech:  Pressured and Rambling  Volume:  Increased  Mood:  Irritable  Affect:  Inappropriate and Labile  Thought Process:  Circumstantial, Disorganized and Tangential  Orientation:  Other:  poor  Thought Content:  Delusions and Paranoid Ideation  Suicidal Thoughts:  No  Homicidal Thoughts:  No  Memory:  Immediate;   Poor Recent;   Poor Remote;   Poor  Judgement:  Impaired  Insight:  Lacking  Psychomotor Activity:  Increased  Concentration:  Poor  Recall:  Poor  Fund of Knowledge:Limited  Language: Fair  Akathisia:  No  Handed:  Right  AIMS (if indicated):     Assets:  Housing  Sleep:  Number of Hours: 3.75   Musculoskeletal: Strength & Muscle Tone: Patient is thin built Gait & Station: normal Patient leans: N/A  Current Medications: Current Facility-Administered Medications  Medication Dose Route Frequency Provider Last Rate Last Dose  . acetaminophen (TYLENOL) tablet 650 mg  650 mg Oral Q6H PRN Waylan Boga, NP      . alum & mag hydroxide-simeth (MAALOX/MYLANTA) 200-200-20 MG/5ML suspension 30 mL  30 mL Oral Q4H PRN Waylan Boga, NP      . amLODipine (NORVASC) tablet 2.5 mg  2.5 mg Oral Daily Waylan Boga, NP   2.5 mg at 12/23/13 0746  . aspirin EC tablet 81 mg  81 mg Oral Daily Waylan Boga, NP   81 mg at 12/23/13 0745  . divalproex (DEPAKOTE ER) 24 hr tablet 250 mg  250 mg Oral BID Kathlee Nations,  MD   250 mg at 12/23/13 0746  . feeding supplement (ENSURE COMPLETE) (ENSURE COMPLETE) liquid 237 mL  237 mL Oral BID BM Kathlee Nations, MD   237 mL at 12/23/13 1014  . fluPHENAZine (PROLIXIN) tablet 10 mg  10 mg Oral BID Waylan Boga, NP   10 mg at 12/23/13 0745  . levothyroxine (SYNTHROID, LEVOTHROID) tablet 50 mcg  50 mcg Oral QAC breakfast Waylan Boga, NP   50 mcg at 12/23/13 [phone]  . magnesium hydroxide (MILK OF MAGNESIA) suspension 30 mL  30 mL Oral Daily PRN Waylan Boga, NP       . OLANZapine zydis (ZYPREXA) disintegrating tablet 5 mg  5 mg Oral Q8H PRN Elmarie Shiley, NP   5 mg at 12/23/13 0130  . tamsulosin (FLOMAX) capsule 0.4 mg  0.4 mg Oral Daily Waylan Boga, NP   0.4 mg at 12/23/13 0745  . trihexyphenidyl (ARTANE) tablet 5 mg  5 mg Oral BID WC Rilee Wendling   5 mg at 12/23/13 0745    Lab Results:  Results for orders placed during the hospital encounter of 12/18/13 (from the past 48 hour(s))  BASIC METABOLIC PANEL     Status: Abnormal   Collection Time    12/21/13  7:30 PM      Result Value Ref Range   Sodium 139  137 - 147 mEq/L   Potassium 4.4  3.7 - 5.3 mEq/L   Chloride 102  96 - 112 mEq/L   CO2 23  19 - 32 mEq/L   Glucose, Bld 100 (*) 70 - 99 mg/dL   BUN 20  6 - 23 mg/dL   Creatinine, Ser 1.21  0.50 - 1.35 mg/dL   Calcium 9.0  8.4 - 10.5 mg/dL   GFR calc non Af Amer 62 (*) >90 mL/min   GFR calc Af Amer 71 (*) >90 mL/min   Comment: (NOTE)     The eGFR has been calculated using the CKD EPI equation.     This calculation has not been validated in all clinical situations.     eGFR's persistently <90 mL/min signify possible Chronic Kidney     Disease.     Performed at [facility]     Status: None   Collection Time    12/21/13  7:30 PM      Result Value Ref Range   Hemoglobin A1C 5.1  <5.7 %   Comment: (NOTE)                                                                               According to the ADA Clinical Practice Recommendations for 2011, when     HbA1c is used as a screening test:      >=6.5%   Diagnostic of Diabetes Mellitus               (if abnormal result is confirmed)     5.7-6.4%   Increased risk of developing Diabetes Mellitus     References:Diagnosis and Classification of Diabetes Mellitus,Diabetes     MEQA,8341,96(QIWLN 1):S62-S69 and Standards of Medical Care in             Diabetes -  2011,Diabetes YDXA,1287,86 (Suppl 1):S11-S61.   Mean Plasma Glucose 100  <117 mg/dL   Comment: Performed  at [facility]    Physical Findings: AIMS: Facial and Oral Movements Muscles of Facial Expression: None, normal Lips and Perioral Area: None, normal Jaw: None, normal Tongue: None, normal,Extremity Movements Upper (arms, wrists, hands, fingers): None, normal Lower (legs, knees, ankles, toes): None, normal, Trunk Movements Neck, shoulders, hips: None, normal, Overall Severity Severity of abnormal movements (highest score from questions above): None, normal Incapacitation due to abnormal movements: None, normal Patient's awareness of abnormal movements (rate only patient's report): No Awareness, Dental Status Current problems with teeth and/or dentures?: Yes Does patient usually wear dentures?: No  CIWA:    COWS:     Treatment Plan Summary: Daily contact with patient to assess and evaluate symptoms and progress in treatment Medication management Plan: 1  Admit for crisis management and stabilization. 2.  Medication management to reduce symptoms to baseline and improved the patient's overall level of functioning.  Closely monitor the side effects, efficacy and therapeutic response of medication. Continue Depakote 250mg  bid for mood stabilization, Prolixin 10mg  bid for psychosis/delusions and Artane 5mg  bid for EPS prevention. 3.  Treat health problem as indicated. 4.  Developed treatment plan to decrease the risk of relapse upon discharge and to reduce the need for readmission. 5.  Psychosocial education regarding relapse prevention in self-care. 6.  Healthcare followup as needed for medical problems and called consults as indicated.   7. Encouraged to participate and verbalize into group milieu therapy.   Medical Decision Making Problem Points:  Established problem, improving (1) and Review of psycho-social stressors (1) Data Points:  Review or order clinical lab tests (1) Review and summation of old records (2) Review of medication regiment & side effects (2)  I  certify that inpatient services furnished can reasonably be expected to improve the patient's condition.   Corena Pilgrim, MD 12/23/2013, 10:33 AM

## 2013-12-23 NOTE — [facility] Group Notes (Signed)
[facility]   12/23/2013 1:42 PM  Participation Quality:  Engaged  Mood/Affect:  Appropriate  Depression Rating:  denies  Anxiety Rating:  denies  Thoughts of Suicide:  No Will you contract for safety?   NA  Current AVH:  No  Plan for Discharge/Comments:  "I refuse to eat any hog, but I had sausage links for breakfast, along with a fruit cup.  I might have it again.  I might not be in my right mind, but I am controlled."  Chioke contributed these and other interesting, rather nonsensical phrases and sentences.  No complaints.  Transportation Means: Ms Laural BenesJohnson  Supports:  Ms Mila HomerJohnson  Zarra Geffert, Baldo DaubRodney B

## 2013-12-24 MED ORDER — TRAZODONE HCL 50 MG PO TABS: 50.0000 mg | ORAL_TABLET | Freq: Every evening | ORAL | Status: DC | PRN

## 2013-12-24 MED ORDER — TRAZODONE HCL 50 MG PO TABS
50.0000 mg | ORAL_TABLET | Freq: Every day | ORAL | Status: DC
  Administered 2013-12-24: 50 mg via ORAL
  Filled 2013-12-24 (×3): qty 1
  Filled 2013-12-24: qty 14

## 2013-12-24 NOTE — Tx Team (Signed)
  Interdisciplinary Treatment Plan Update   Date Reviewed:  12/24/2013  Time Reviewed:  11:23 AM  Progress in Treatment:   Attending groups: Yes Participating in groups: Yes Taking medication as prescribed: Yes  Tolerating medication: Yes Family/Significant other contact made: Yes  Patient understands diagnosis: Yes  Discussing patient identified problems/goals with staff: Yes Medical problems stabilized or resolved: Yes Denies suicidal/homicidal ideation: Yes Patient has not harmed self or others: Yes  For review of initial/current patient goals, please see plan of care.  Estimated Length of Stay:  D/C tomorrow  Reason for Continuation of Hospitalization:   New Problems/Goals identified:  N/A  Discharge Plan or Barriers:   return home, follow up outpt  Additional Comments:  Attendees:  Signature: Thedore MinsMojeed Akintayo, MD 12/24/2013 11:23 AM   Signature: Richelle Itood Sindi Beckworth, LCSW 12/24/2013 11:23 AM  Signature: Fransisca KaufmannLaura Davis, NP 12/24/2013 11:23 AM  Signature: Joslyn Devonaroline Beaudry, RN 12/24/2013 11:23 AM  Signature: Liborio NixonPatrice White, RN 12/24/2013 11:23 AM  Signature:  12/24/2013 11:23 AM  Signature:   12/24/2013 11:23 AM  Signature:    Signature:    Signature:    Signature:    Signature:    Signature:      Scribe for Treatment Team:   Richelle Itood Haskell Rihn, LCSW  12/24/2013 11:23 AM

## 2013-12-24 NOTE — Care Management Utilization Note (Signed)
   Per State Regulation 482.30  This chart was reviewed for necessity with respect to the patient's Admission/ Duration of stay.  Next review date:  12/27/13   Rilley Poulter Morrison RN, BSN 

## 2013-12-24 NOTE — Progress Notes (Signed)
D: Patient denies SI/HI and auditory and visual hallucinations. The patient has an anxious mood and an appropriate affect. The patient is incoherent at times and presents as disorganized. However, the patient is attending groups and interacting appropriately within the milieu.  A: Patient given emotional support from RN. Patient encouraged to come to staff with concerns and/or questions. Patient's medication routine continued. Patient's orders and plan of care reviewed.  R: Patient remains cooperative. Will continue to monitor patient q15 minutes for safety.

## 2013-12-24 NOTE — Progress Notes (Signed)
Morning Wellness Group  The focus of this group is to educate the patient on the purpose and policies of crisis stabilization and provide a format to answer questions about their admission.  The group details unit policies and expectations of patients while admitted. Patient stated his goal today is to "be happy." The patient also described his leisure activities and ways in which to minimize his anger and stress levels.

## 2013-12-24 NOTE — Progress Notes (Signed)
[facility] :  Will you be returning to the same living situation after discharge: Yes,  home At discharge, do you have transportation home?:Yes,  [name] Do you have the ability to [facility] for your medications:[facility]  Release of information consent forms completed and in the chart;  Patient's signature needed at discharge.  Patient to Follow up at: Follow-up Information   Follow up with [facility] On 12/29/2013. (Go to the walk-in clinic on Tuesday between 8 and 10 for your hospital follow up appointment)    Contact information:   [address]  [place]  [phone]      Patient denies SI/HI:   Yes,  yes    Safety Planning and Suicide Prevention discussed:  Yes,  yes  Kevin Fowler, Kevin Fowler 12/24/2013, 11:28 AM

## 2013-12-24 NOTE — BHH Group Notes (Signed)
BHH Group Notes:  (Counselor/Nursing/MHT/Case Management/Adjunct)  12/24/2013 1:15PM  Type of Therapy:  Group Therapy  Participation Level:  Active  Participation Quality:  Appropriate  Affect:  Pleasant  Cognitive:  Disorganized  Insight:  Limited  Engagement in Group:  Limited  Engagement in Therapy:  Limited  Modes of Intervention:  Discussion, Exploration and Socialization  Summary of Progress/Problems: The topic for group was balance in life.  Pt participated in the discussion about when their life was in balance and out of balance and how this feels.  Pt discussed ways to get back in balance and short term goals they can work on to get where they want to be. "If I had one year left to live, I would cure mental health chemical imbalance."  Asah had many other things to say, but it was difficult to decipher between the garbled speech and flight of ideas.   Daryel Geraldorth, Rosselyn Martha B 12/24/2013 1:27 PM

## 2013-12-24 NOTE — Progress Notes (Signed)
Didn't attend group 

## 2013-12-24 NOTE — Progress Notes (Signed)
Adult Psychoeducational Group Note  Date:  12/24/2013 Time:  1:37 AM  Group Topic/Focus:   Wrap-Up Group:   The focus of this group is to help patients review their daily goal of treatment and discuss progress on daily workbooks.  Participation Level:  Active  Participation Quality:  Appropriate  Affect:  Appropriate  Cognitive:  Appropriate  Insight: Appropriate  Engagement in Group:  Engaged  Modes of Intervention:  Support  Additional Comments:  Pt stated that one positive thing that happened today was that he was able to stay on his diet and not drink any caffeine except for breakfast. Pt was encouraged to keep doing what he was doing and stick to his goal  Kevin Fowler 12/24/2013, 1:37 AM

## 2013-12-24 NOTE — Progress Notes (Signed)
Patient ID: Kevin Fowler, male   DOB: 17-Dec-1950, 63 y.o.   MRN: [id number] [facility] MD Progress Note  12/24/2013 2:44 PM Dennard Vezina  MRN:  [id number] Subjective: Patient states "I heard some voices this morning. They said something about me being a queer. I have a complex. I'm just glad that nobody has killed me yet. I think my goal for today is to have five cokes."   Objective: Patient thoughts process remains bizarre and disorganized.  Patient reports decreased symptoms of depression and hearing voices. Patient continues to have trouble sleeping with zero hours being documented last night. However, he continues to have delusions that someone might kill him at anytime. He has been observed by staff to be talking to himself as if responding to internal stimuli. However, patient has been compliant with his medications and has not endorsed any adverse reactions.  Diagnosis:   DSM5: Schizophrenia Disorders:  Delusional Disorder (297.1) Total Time spent with patient: 20 minutes Axis I: Schizoaffective disorder Axis II: Deferred Axis III:  Past Medical History  Diagnosis Date  . Diabetes mellitus   . Hypertension    Axis IV: other psychosocial or environmental problems, problems related to social environment and problems with primary support group  ADL's:  Intact  Sleep: Fair  Appetite:  Fair  Suicidal Ideation:  Plan:  None Intent:  None Means:  None Homicidal Ideation:  Plan:  None Intent:  None Means:  None AEB (as evidenced by):  Psychiatric Specialty Exam: Physical Exam  Review of Systems  Constitutional: Negative.   HENT: Negative.   Eyes: Negative.   Respiratory: Negative.   Cardiovascular: Negative.   Gastrointestinal: Negative.   Genitourinary: Negative.   Musculoskeletal: Negative.   Skin: Negative.   Neurological: Negative.   Endo/Heme/Allergies: Negative.   Psychiatric/Behavioral: Positive for hallucinations. Negative for depression, suicidal ideas, memory  loss and substance abuse. The patient is nervous/anxious and has insomnia.     Blood pressure 151/85, pulse 81, temperature 98.4 F (36.9 C), temperature source Oral, resp. rate 20, height 5\' 10"  (1.778 m), weight 65.318 kg (144 lb).Body mass index is 20.66 kg/(m^2).  General Appearance: Disheveled and Guarded  Eye Solicitor::  Fair  Speech:  Pressured and Rambling  Volume:  Increased  Mood:  Irritable  Affect:  Inappropriate and Labile  Thought Process:  Circumstantial, Disorganized and Tangential  Orientation:  Other:  poor  Thought Content:  Delusions and Paranoid Ideation  Suicidal Thoughts:  No  Homicidal Thoughts:  No  Memory:  Immediate;   Poor Recent;   Poor Remote;   Poor  Judgement:  Impaired  Insight:  Lacking  Psychomotor Activity:  Increased and Restlessness  Concentration:  Poor  Recall:  Poor  Fund of Knowledge:Limited  Language: Fair  Akathisia:  No  Handed:  Right  AIMS (if indicated):     Assets:  Housing Leisure Time  Sleep:  Number of Hours: 0   Musculoskeletal: Strength & Muscle Tone: Patient is thin built Gait & Station: normal Patient leans: N/A  Current Medications: Current Facility-Administered Medications  Medication Dose Route Frequency Provider Last Rate Last Dose  . acetaminophen (TYLENOL) tablet 650 mg  650 mg Oral Q6H PRN Nanine Means, NP      . alum & mag hydroxide-simeth (MAALOX/MYLANTA) 200-200-20 MG/5ML suspension 30 mL  30 mL Oral Q4H PRN Nanine Means, NP      . amLODipine (NORVASC) tablet 2.5 mg  2.5 mg Oral Daily Nanine Means, NP   2.5 mg at 12/24/13 0755  .  aspirin EC tablet 81 mg  81 mg Oral Daily Nanine MeansJamison Lord, NP   81 mg at 12/24/13 0755  . divalproex (DEPAKOTE ER) 24 hr tablet 250 mg  250 mg Oral BID Cleotis NipperSyed T Arfeen, MD   250 mg at 12/24/13 0755  . feeding supplement (ENSURE COMPLETE) (ENSURE COMPLETE) liquid 237 mL  237 mL Oral BID BM Cleotis NipperSyed T Arfeen, MD   237 mL at 12/24/13 0757  . fluPHENAZine (PROLIXIN) tablet 10 mg  10 mg Oral BID  Nanine MeansJamison Lord, NP   10 mg at 12/24/13 0755  . levothyroxine (SYNTHROID, LEVOTHROID) tablet 50 mcg  50 mcg Oral QAC breakfast Nanine MeansJamison Lord, NP   50 mcg at 12/24/13 0610  . magnesium hydroxide (MILK OF MAGNESIA) suspension 30 mL  30 mL Oral Daily PRN Nanine MeansJamison Lord, NP   30 mL at 12/23/13 2315  . OLANZapine zydis (ZYPREXA) disintegrating tablet 5 mg  5 mg Oral Q8H PRN Fransisca KaufmannLaura Davis, NP   5 mg at 12/24/13 0004  . tamsulosin (FLOMAX) capsule 0.4 mg  0.4 mg Oral Daily Nanine MeansJamison Lord, NP   0.4 mg at 12/24/13 0755  . trihexyphenidyl (ARTANE) tablet 5 mg  5 mg Oral BID WC Malynda Smolinski   5 mg at 12/24/13 [id number]    Lab Results:  No results found for this or any previous visit (from the past 48 hour(s)).  Physical Findings: AIMS: Facial and Oral Movements Muscles of Facial Expression: None, normal Lips and Perioral Area: None, normal Jaw: None, normal Tongue: None, normal,Extremity Movements Upper (arms, wrists, hands, fingers): None, normal Lower (legs, knees, ankles, toes): None, normal, Trunk Movements Neck, shoulders, hips: None, normal, Overall Severity Severity of abnormal movements (highest score from questions above): None, normal Incapacitation due to abnormal movements: None, normal Patient's awareness of abnormal movements (rate only patient's report): No Awareness, Dental Status Current problems with teeth and/or dentures?: Yes Does patient usually wear dentures?: No  CIWA:    COWS:     Treatment Plan Summary: Daily contact with patient to assess and evaluate symptoms and progress in treatment Medication management Plan: 1  Continue crisis management and stabilization. 2.  Medication management to reduce symptoms to baseline and improved the patient's overall level of functioning. Closely monitor the side effects, efficacy and therapeutic response of medication. Continue Depakote 250mg  bid for mood stabilization, Prolixin 10mg  bid for psychosis/delusions and Artane 5mg  bid for EPS  prevention. Start Trazodone 50 mg at hs for insomnia.  3.  Treat health problem as indicated. 4.  Developed treatment plan to decrease the risk of relapse upon discharge and to reduce the need for readmission. Anticipate d/c tomorrow. Depakote level in am of 12/25/13.  5.  Psychosocial education regarding relapse prevention in self-care. 6.  Healthcare followup as needed for medical problems and called consults as indicated.   7. Encouraged to participate and verbalize into group milieu therapy.  Medical Decision Making Problem Points:  Established problem, improving (1) and Review of psycho-social stressors (1) Data Points:  Order Aims Assessment (2) Review and summation of old records (2) Review of medication regiment & side effects (2)  I certify that inpatient services furnished can reasonably be expected to improve the patient's condition.   Fransisca KaufmannDAVIS, LAURA, NP-C 12/24/2013, 2:44 PM  Patient seen, evaluated and I agree with notes by Nurse Practitioner. Thedore MinsMojeed Elliannah Wayment, MD

## 2013-12-25 LAB — VALPROIC ACID LEVEL: Valproic Acid Lvl: 26.5 ug/mL — ABNORMAL LOW (ref 50.0–100.0)

## 2013-12-25 MED ORDER — FLUPHENAZINE HCL 10 MG PO TABS: 10.0000 mg | ORAL_TABLET | Freq: Two times a day (BID) | ORAL | Status: AC

## 2013-12-25 MED ORDER — DIVALPROEX SODIUM ER 250 MG PO TB24: 250.0000 mg | ORAL_TABLET | Freq: Two times a day (BID) | ORAL | Status: AC

## 2013-12-25 MED ORDER — AMLODIPINE BESYLATE 2.5 MG PO TABS: 2.5000 mg | ORAL_TABLET | Freq: Every day | ORAL | Status: AC

## 2013-12-25 MED ORDER — TRAZODONE HCL 50 MG PO TABS: 50.0000 mg | ORAL_TABLET | Freq: Every day | ORAL | Status: AC

## 2013-12-25 MED ORDER — TAMSULOSIN HCL 0.4 MG PO CAPS: 0.4000 mg | ORAL_CAPSULE | Freq: Every day | ORAL | Status: AC

## 2013-12-25 MED ORDER — LEVOTHYROXINE SODIUM 50 MCG PO TABS: 50.0000 ug | ORAL_TABLET | Freq: Every day | ORAL | Status: AC

## 2013-12-25 MED ORDER — TRIHEXYPHENIDYL HCL 5 MG PO TABS: 5.0000 mg | ORAL_TABLET | Freq: Two times a day (BID) | ORAL | Status: AC

## 2013-12-25 MED ORDER — ASPIRIN EC 81 MG PO TBEC: 81.0000 mg | DELAYED_RELEASE_TABLET | Freq: Every day | ORAL | Status: AC

## 2013-12-25 NOTE — Progress Notes (Signed)
D   Pt is irritable and anxious this evening   He has isolated to his room   Pt came down to the medication window with a wet towel wrapped around him requesting medication to help him sleep as he has " profound mental illness"  He said he could not find his shirt and he was taught to respect women    A   Verbal support given   Medications administered and effectiveness monitored   Q 15 min checks R   Pt safe at present

## 2013-12-25 NOTE — [facility] Suicide Risk Assessment (Signed)
   Demographic Factors:  Male, Caucasian, Low socioeconomic status and Unemployed  Total Time spent with patient: 15 minutes  Psychiatric Specialty Exam: Physical Exam  Psychiatric: He has a normal mood and affect. His behavior is normal. Judgment and thought content normal. His speech is rapid and/or pressured. Cognition and memory are normal.    Review of Systems  Constitutional: Negative.   HENT: Negative.   Eyes: Negative.   Respiratory: Negative.   Cardiovascular: Negative.   Gastrointestinal: Negative.   Genitourinary: Negative.   Musculoskeletal: Negative.   Skin: Negative.   Neurological: Negative.   Endo/Heme/Allergies: Negative.   Psychiatric/Behavioral: Negative.     Blood pressure 126/77, pulse 57, temperature 98.3 F (36.8 C), temperature source Oral, resp. rate 18, height 5\' 10"  (1.778 m), weight 65.318 kg (144 lb).Body mass index is 20.66 kg/(m^2).  General Appearance: Fairly Groomed  Patent attorneyye Contact::  Fair  Speech:  Pressured  Volume:  Normal  Mood:  Euthymic  Affect:  Blunt  Thought Process:  Disorganized  Orientation:  Only to Place and Person)  Thought Content:  Negative  Suicidal Thoughts:  No  Homicidal Thoughts:  No  Memory:  Immediate;   Fair Recent;   Fair Remote;   Fair  Judgement:  Fair  Insight:  Fair  Psychomotor Activity:  Normal  Concentration:  Fair  Recall:  [employer]Fair  Fund of Knowledge:Fair  Language: Fair  Akathisia:  No  Handed:  Right  AIMS (if indicated):     Assets:  Desire for Improvement Social Support  Sleep:  Number of Hours: 3.5    Musculoskeletal: Strength & Muscle Tone: within normal limits Gait & Station: normal Patient leans: N/A   Mental Status Per Nursing Assessment::   On Admission:     Current Mental Status by Physician: patient denies suicidal ideation, intent or plan  Loss Factors: Decrease in vocational status, Decline in physical health and Financial problems/change in socioeconomic status  Historical  Factors: Family history of mental illness   Risk Reduction Factors:   Living with another person, a guardian and Positive social support  Continued Clinical Symptoms:  Schizophrenia:   Paranoid or undifferentiated type  Cognitive Features That Contribute To Risk:  Closed-mindedness Polarized thinking    Suicide Risk:  Minimal: No identifiable suicidal ideation.  Patients presenting with no risk factors but with morbid ruminations; may be classified as minimal risk based on the severity of the depressive symptoms  Discharge Diagnoses:   AXIS I:  Schizoaffective disorder, unspecified condition  AXIS II:  Deferred AXIS III:   Past Medical History  Diagnosis Date  . Diabetes mellitus   . Hypertension    AXIS IV:  other psychosocial or environmental problems and problems related to social environment AXIS V:  61-70 mild symptoms  Plan Of Care/Follow-up recommendations:  Activity:  as tolerated Diet:  healthy Tests:  routine Other:  patient to keep his aftercare appointment with Dr. Geanie CooleyLay  Is patient on multiple antipsychotic therapies at discharge:  No   Has Patient had three or more failed trials of antipsychotic monotherapy by history:  No  Recommended Plan for Multiple Antipsychotic Therapies: NA    Thedore MinsAkintayo, Sharmon Cheramie, MD 12/25/2013, 10:35 AM

## 2013-12-25 NOTE — Progress Notes (Signed)
Adult Psychoeducational Group Note  Date:  12/25/2013 Time:  10:00AM  Group Topic/Focus:  Self Care:   The focus of this group is to help patients understand the importance of self-care in order to improve or restore emotional, physical, spiritual, interpersonal, and financial health.  Participation Level:  Active  Participation Quality:  Sharing  Affect:  Excited  Cognitive:  Disorganized  Insight: Limited  Engagement in Group:  Distracting and Engaged  Modes of Intervention:  Discussion  Additional Comments:  Pt's speech and thought expression were jumbled and disorganized. Pt was unable to formulate a coherent response to the majority of the statements presented in the group session. Pt however did indicate that volleyball was something he liked to do and when asked if he feels like it can be a future coping skill he responded "yes". Pt remained in the group for the entire session.   Zacarias PontesSmith, Norabelle Kondo R 12/25/2013, 10:59 AM
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
## 2013-12-25 NOTE — Discharge Instructions (Signed)
Schizophrenia  Schizophrenia is a mental illness. It may cause disturbed or disorganized thinking, speech, or behavior. People with schizophrenia have problems functioning in one or more areas of life: work, school, home, or relationships. People with schizophrenia are at increased risk for suicide, certain chronic physical illnesses, and unhealthy behaviors, such as smoking and drug use.  People who have family members with schizophrenia are at higher risk of developing the illness. Schizophrenia affects men and women equally but usually appears at an earlier age (teenage or early adult years) in men.   SYMPTOMS  The earliest symptoms are often subtle (prodrome) and may go unnoticed until the illness becomes more severe (first-break psychosis). Symptoms of schizophrenia may be continuous or may come and go in severity. Episodes often are triggered by major life events, such as family stress, college, military service, marriage, pregnancy or child birth, divorce, or loss of a loved one. People with schizophrenia may see, hear, or feel things that do not exist (hallucinations). They may have false beliefs in spite of obvious proof to the contrary (delusions). Sometimes speech is incoherent or behavior is odd or withdrawn.   DIAGNOSIS  Schizophrenia is diagnosed through an assessment by your caregiver. Your caregiver will ask questions about your thoughts, behavior, mood, and ability to function in daily life. Your caregiver may ask questions about your medical history and use of alcohol or drugs, including prescription medication. Your caregiver may also order blood tests and imaging exams. Certain medical conditions and substances can cause symptoms that resemble schizophrenia. Your caregiver may refer you to a mental health specialist for evaluation. There are three major criterion for a diagnosis of schizophrenia:  · Two or more of the following five symptoms are present for a month or longer:  · Delusions. Often  the delusions are that you are being attacked, harassed, cheated, persecuted or conspired against (persecutory delusions).  · Hallucinations.    · Disorganized speech that does not make sense to others.  · Grossly disorganized (confused or unfocused) behavior or extremely overactive or underactive motor activity (catatonia).  · Negative symptoms such as bland or blunted emotions (flat affect), loss of will power (avolition), and withdrawal from social contacts (social isolation).  · Level of functioning in one or more major areas of life (work, school, relationships, or self-care) is markedly below the level of functioning before the onset of illness.    · There are continuous signs of illness (either mild symptoms or decreased level of functioning) for at least 6 months or longer.  TREATMENT   Schizophrenia is a long-term illness. It is best controlled with continuous treatment rather than treatment only when symptoms occur. The following treatments are used to manage schizophrenia:  · Medication Medication is the most effective and important form of treatment for schizophrenia. Antipsychotic medications are usually prescribed to help manage schizophrenia. Other types of medication may be added to relieve any symptoms that may occur despite the use of antipsychotic medications.  · Counseling or talk therapy Individual, group, or family counseling may be helpful in providing education, support, and guidance. Many people with schizophrenia also benefit from social skills and job skills (vocational) training.  A combination of medication and counseling is best for managing the disorder over time. A procedure in which electricity is applied to the brain through the scalp (electroconvulsive therapy) may be used to treat catatonic schizophrenia or schizophrenia in people who cannot take or do not respond to medication and counseling.  Document Released: 10/26/2000 Document Revised: 07/01/2013   Document Reviewed:  01/21/2013  [employer]® Patient Information ©2014 [employer].

## 2013-12-25 NOTE — Progress Notes (Signed)
Pt discharged per MD orders; pt currently denies SI/HI and auditory/visual hallucinations; pt was given education by RN regarding follow-up appointments and medications and pt denied any questions or concerns about these instructions; pt was then escorted to search room to retrieve his belongings by RN before being discharged to hospital lobby. 

## 2013-12-30 NOTE — Progress Notes (Signed)
Patient Discharge Instructions:  After Visit Summary (AVS):   Faxed to:  12/30/13 Discharge Summary Note:   Faxed to:  12/30/13 Psychiatric Admission Assessment Note:   Faxed to:  12/30/13 Suicide Risk Assessment - Discharge Assessment:   Faxed to:  12/30/13 Faxed/Sent to the Next Level Care provider:  12/30/13 [facility] @ [phone]  Jerelene ReddenSheena E Magnolia, 12/30/2013, 4:12 PM

## 2016-03-12 DEATH — deceased
